# Patient Record
Sex: Male | Born: 1980 | Race: Black or African American | Hispanic: No | Marital: Single | Smoking: Never smoker
Health system: Southern US, Community
[De-identification: ages and names within clinical notes are randomized; demographics above are authoritative.]

## PROBLEM LIST (undated history)

## (undated) HISTORY — PX: NO PAST SURGERIES: SHX2092

---
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
## 2006-02-22 ENCOUNTER — Emergency Department: Payer: Self-pay | Admitting: Emergency Medicine

## 2007-08-03 ENCOUNTER — Emergency Department (HOSPITAL_COMMUNITY): Admission: EM | Admit: 2007-08-03 | Discharge: 2007-08-03 | Payer: Self-pay | Admitting: Family Medicine

## 2009-12-31 ENCOUNTER — Emergency Department: Payer: Self-pay | Admitting: Emergency Medicine

## 2010-08-14 ENCOUNTER — Emergency Department: Payer: Self-pay | Admitting: Emergency Medicine

## 2010-09-25 ENCOUNTER — Emergency Department (HOSPITAL_COMMUNITY)
Admission: EM | Admit: 2010-09-25 | Discharge: 2010-09-25 | Disposition: A | Discharge status: Home / Self Care | Payer: 59 | Attending: Emergency Medicine | Admitting: Emergency Medicine

## 2010-09-25 DIAGNOSIS — IMO0002 Reserved for concepts with insufficient information to code with codable children: Secondary | ICD-10-CM | POA: Insufficient documentation

## 2010-09-25 DIAGNOSIS — T18108A Unspecified foreign body in esophagus causing other injury, initial encounter: Secondary | ICD-10-CM | POA: Insufficient documentation

## 2012-02-04 ENCOUNTER — Emergency Department (HOSPITAL_COMMUNITY)
Admission: EM | Admit: 2012-02-04 | Discharge: 2012-02-04 | Disposition: A | Discharge status: Home / Self Care | Payer: 59 | Attending: Emergency Medicine | Admitting: Emergency Medicine

## 2012-02-04 ENCOUNTER — Encounter (HOSPITAL_COMMUNITY): Payer: Self-pay | Admitting: Emergency Medicine

## 2012-02-04 DIAGNOSIS — K089 Disorder of teeth and supporting structures, unspecified: Secondary | ICD-10-CM | POA: Insufficient documentation

## 2012-02-04 DIAGNOSIS — K044 Acute apical periodontitis of pulpal origin: Secondary | ICD-10-CM | POA: Insufficient documentation

## 2012-02-04 DIAGNOSIS — R220 Localized swelling, mass and lump, head: Secondary | ICD-10-CM | POA: Insufficient documentation

## 2012-02-04 DIAGNOSIS — K047 Periapical abscess without sinus: Secondary | ICD-10-CM

## 2012-02-04 DIAGNOSIS — R51 Headache: Secondary | ICD-10-CM | POA: Insufficient documentation

## 2012-02-04 DIAGNOSIS — K0889 Other specified disorders of teeth and supporting structures: Secondary | ICD-10-CM

## 2012-02-04 MED ORDER — BUPIVACAINE-EPINEPHRINE 0.5% -1:200000 IJ SOLN: 2.0000 mL | Freq: Once | INTRAMUSCULAR | Status: DC

## 2012-02-04 MED ORDER — OXYCODONE-ACETAMINOPHEN 5-325 MG PO TABS: 1.0000 | ORAL_TABLET | ORAL | Status: DC | PRN

## 2012-02-04 MED ORDER — PENICILLIN V POTASSIUM 250 MG PO TABS: 250.0000 mg | ORAL_TABLET | Freq: Four times a day (QID) | ORAL | Status: AC

## 2012-02-04 MED ORDER — BUPIVACAINE-EPINEPHRINE PF 0.5-1:200000 % IJ SOLN
1.8000 mL | Freq: Once | INTRAMUSCULAR | Status: DC
  Filled 2012-02-04: qty 1.8

## 2012-02-04 NOTE — ED Provider Notes (Signed)
History   This chart was scribed for non-physician practitioner working with Dione Booze, MD by Gerlean Ren, ED Scribe. This patient was seen in room WTR6/WTR6 and the patient's care was started at 8:34 PM.    CSN: [id number]  Arrival date & time 02/04/12  1953   First MD Initiated Contact with Patient 02/04/12 2021      Chief Complaint  Patient presents with  . Dental Pain     The history is provided by the patient and medical records. No language interpreter was used.   Jeremy Koch is a 32 y.o. male who presents to the Emergency Department complaining of constant, non-radiating, gradually worsening right side dental pain on upper and lower mouth as result of two suspected broken teeth.  Pt thinks he broke teeth 2 weeks ago while eating.  Associated gum swelling.  Pain has no improving or worsening factors. Pt rates the pain at a 10/10, and is sharp in nature.  Pt states today pain was worse and he noticed some right-sided facial swelling.  Pain is worse with eating and cold air and nothing makes it better.  Pt denies fever, chills, nausea, emesis, otalgia.  Pt reports mild occasional associated HA not present currently.  Pt denies tobacco and alcohol use.     History reviewed. No pertinent past medical history.  History reviewed. No pertinent past surgical history.  No family history on file.  History  Substance Use Topics  . Smoking status: Never Smoker   . Smokeless tobacco: Not on file  . Alcohol Use: No      Review of Systems  Constitutional: Negative for fever, chills and appetite change.  HENT: Positive for facial swelling and dental problem. Negative for ear pain, nosebleeds, rhinorrhea, drooling, trouble swallowing, neck pain, neck stiffness and postnasal drip.   Eyes: Negative for pain and redness.  Respiratory: Negative for cough and wheezing.   Cardiovascular: Negative for chest pain.  Gastrointestinal: Negative for nausea, vomiting and abdominal pain.    Skin: Negative for color change and rash.  Neurological: Positive for headaches. Negative for weakness and light-headedness.  All other systems reviewed and are negative.    Allergies  Review of patient's allergies indicates no known allergies.  Home Medications   Current Outpatient Rx  Name  Route  Sig  Dispense  Refill  . IBUPROFEN 200 MG PO TABS   Oral   Take 200 mg by mouth every 6 (six) hours as needed. Pain         . OXYCODONE-ACETAMINOPHEN 5-325 MG PO TABS   Oral   Take 1 tablet by mouth every 4 (four) hours as needed for pain.   20 tablet   0   . PENICILLIN V POTASSIUM 250 MG PO TABS   Oral   Take 1 tablet (250 mg total) by mouth 4 (four) times daily.   40 tablet   0     BP 155/77  Pulse 98  Temp 99.1 F (37.3 C)  Resp 16  SpO2 100%  Physical Exam  Nursing note and vitals reviewed. Constitutional: He is oriented to person, place, and time. He appears well-developed and well-nourished.  HENT:  Head: Normocephalic.  Right Ear: Tympanic membrane, external ear and ear canal normal.  Left Ear: Tympanic membrane, external ear and ear canal normal.  Nose: Nose normal. Right sinus exhibits no maxillary sinus tenderness and no frontal sinus tenderness. Left sinus exhibits no maxillary sinus tenderness and no frontal sinus tenderness.  Mouth/Throat: Uvula is  midline, oropharynx is clear and moist and mucous membranes are normal. No oral lesions. Abnormal dentition. Dental caries present. No uvula swelling or lacerations. No oropharyngeal exudate, posterior oropharyngeal edema, posterior oropharyngeal erythema or tonsillar abscesses.         Dental carries throughout, no broken teeth, right side upper and lower gumline erythema with mild edema noted around marked teeth, no gross abscess, no edema or erythema of floor of mouth  Eyes: Conjunctivae normal are normal. Pupils are equal, round, and reactive to light. Right eye exhibits no discharge. Left eye exhibits no  discharge.  Neck: Normal range of motion. Neck supple. No spinous process tenderness and no muscular tenderness present. No rigidity. Normal range of motion present.  Cardiovascular: Normal rate, regular rhythm, normal heart sounds and intact distal pulses.   Pulmonary/Chest: Effort normal and breath sounds normal. No respiratory distress. He has no wheezes.  Musculoskeletal: Normal range of motion. He exhibits no tenderness.  Lymphadenopathy:    He has no cervical adenopathy.  Neurological: He is alert and oriented to person, place, and time. He exhibits normal muscle tone. Coordination normal.  Skin: Skin is warm and dry. No rash noted. No erythema.  Psychiatric: He has a normal mood and affect.    ED Course  Dental Date/Time: 02/04/2012 9:05 PM Performed by: Dierdre Forth Authorized by: Dierdre Forth Consent: Verbal consent obtained. Risks and benefits: risks, benefits and alternatives were discussed Consent given by: patient Patient understanding: patient states understanding of the procedure being performed Patient consent: the patient's understanding of the procedure matches consent given Procedure consent: procedure consent matches procedure scheduled Relevant documents: relevant documents present and verified Site marked: the operative site was marked Required items: required blood products, implants, devices, and special equipment available Patient identity confirmed: verbally with patient Time out: Immediately prior to procedure a "time out" was called to verify the correct patient, procedure, equipment, support staff and site/side marked as required. Preparation: Patient was prepped and draped in the usual sterile fashion. Local anesthesia used: yes Anesthesia: local infiltration Local anesthetic: bupivacaine 0.5% with epinephrine Anesthetic total: 0.5 ml Patient sedated: no Patient tolerance: Patient tolerated the procedure well with no immediate  complications. Comments: Dental block of teeth # 3 and #30.  Pt tolerated procedure without complication.  Complete pain relief achieved.    (including critical care time) DIAGNOSTIC STUDIES: Oxygen Saturation is 100% on room air, normal by my interpretation.    COORDINATION OF CARE: 8:40 PM- Patient informed of clinical course including dental block for pain, understands medical decision-making process, and agrees with plan.  Informed pt that I will provide him dentist information so that he can follow-up.     No results found.   1. Pain, dental   2. Dental infection       MDM  Bo Merino presents with dental pain and possible dental infection.  Patient with toothache.  No gross abscess.  Exam unconcerning for Ludwig's angina or spread of infection.  Dental block performed in the department without complication.  Will treat with penicillin and pain medicine.  Urged patient to follow-up with dentist.    1. Medications: percocet, penicillin, usual home medications 2. Treatment: rest, drink plenty of fluids, medications as prescribed 3. Follow Up: Please followup with your primary doctor for discussion of your diagnoses and further evaluation after today's visit; if you do not have a primary care doctor use the resource guide provided to find one; followup with Maurine Minister as discussed   I personally  performed the services described in this documentation, which was scribed in my presence. The recorded information has been reviewed and is accurate.        Dierdre Forth, PA-C 02/04/12 2111

## 2012-02-04 NOTE — ED Notes (Signed)
Pt reports 10/10 dental pain x1 week.

## 2012-02-04 NOTE — ED Provider Notes (Signed)
Medical screening examination/treatment/procedure(s) were performed by non-physician practitioner and as supervising physician I was immediately available for consultation/collaboration.   Dione Booze, MD 02/04/12 2114

## 2014-01-06 ENCOUNTER — Emergency Department (HOSPITAL_COMMUNITY)
Admission: EM | Admit: 2014-01-06 | Discharge: 2014-01-06 | Disposition: A | Discharge status: Home / Self Care | Payer: Self-pay | Attending: Emergency Medicine | Admitting: Emergency Medicine

## 2014-01-06 ENCOUNTER — Encounter (HOSPITAL_COMMUNITY): Payer: Self-pay | Admitting: Emergency Medicine

## 2014-01-06 ENCOUNTER — Emergency Department (HOSPITAL_COMMUNITY): Payer: 59

## 2014-01-06 DIAGNOSIS — L03211 Cellulitis of face: Secondary | ICD-10-CM | POA: Insufficient documentation

## 2014-01-06 DIAGNOSIS — K047 Periapical abscess without sinus: Secondary | ICD-10-CM | POA: Insufficient documentation

## 2014-01-06 LAB — I-STAT CHEM 8, ED
BUN: 15 mg/dL (ref 6–23)
CHLORIDE: 101 meq/L (ref 96–112)
Calcium, Ion: 1.15 mmol/L (ref 1.12–1.23)
Creatinine, Ser: 0.9 mg/dL (ref 0.50–1.35)
GLUCOSE: 104 mg/dL — AB (ref 70–99)
HEMATOCRIT: 46 % (ref 39.0–52.0)
Hemoglobin: 15.6 g/dL (ref 13.0–17.0)
Potassium: 4.1 mmol/L (ref 3.5–5.1)
Sodium: 137 mmol/L (ref 135–145)
TCO2: 21 mmol/L (ref 0–100)

## 2014-01-06 MED ORDER — MORPHINE SULFATE 4 MG/ML IJ SOLN
4.0000 mg | Freq: Once | INTRAMUSCULAR | Status: AC
  Administered 2014-01-06: 4 mg via INTRAVENOUS
  Filled 2014-01-06: qty 1

## 2014-01-06 MED ORDER — OXYCODONE-ACETAMINOPHEN 5-325 MG PO TABS: 1.0000 | ORAL_TABLET | Freq: Four times a day (QID) | ORAL | Status: DC | PRN

## 2014-01-06 MED ORDER — PENICILLIN V POTASSIUM 500 MG PO TABS: 500.0000 mg | ORAL_TABLET | Freq: Four times a day (QID) | ORAL | Status: AC

## 2014-01-06 MED ORDER — IOHEXOL 300 MG/ML  SOLN
100.0000 mL | Freq: Once | INTRAMUSCULAR | Status: AC | PRN
  Administered 2014-01-06: 100 mL via INTRAVENOUS

## 2014-01-06 MED ORDER — MORPHINE SULFATE 4 MG/ML IJ SOLN: 4.0000 mg | Freq: Once | INTRAMUSCULAR | Status: DC

## 2014-01-06 NOTE — ED Notes (Signed)
Swelling noted on l/side of face, back of jaw. Pt reports pain and swelling in mouth x 24 hours. Pain started as dental pain and increased over last 12 hours.

## 2014-01-06 NOTE — Discharge Instructions (Signed)
Abscessed Tooth An abscessed tooth is an infection around your tooth. It may be caused by holes or damage to the tooth (cavity) or a dental disease. An abscessed tooth causes mild to very bad pain in and around the tooth. See your dentist right away if you have tooth or gum pain. HOME CARE  Take your medicine as told. Finish it even if you start to feel better.  Do not drive after taking pain medicine.  Rinse your mouth (gargle) often with salt water ( teaspoon salt in 8 ounces of warm water).  Do not apply heat to the outside of your face. GET HELP RIGHT AWAY IF:   You have a temperature by mouth above 102 F (38.9 C), not controlled by medicine.  You have chills and a very bad headache.  You have problems breathing or swallowing.  Your mouth will not open.  You develop puffiness (swelling) on the neck or around the eye.  Your pain is not helped by medicine.  Your pain is getting worse instead of better. MAKE SURE YOU:   Understand these instructions.  Will watch your condition.  Will get help right away if you are not doing well or get worse. Document Released: 06/09/2007 Document Revised: 03/15/2011 Document Reviewed: 03/31/2010 [employer] Patient Information 2015 [employer]. This information is not intended to replace advice given to you by your health care provider. Make sure you discuss any questions you have with your health care provider.  

## 2014-01-06 NOTE — ED Provider Notes (Signed)
Medical screening exam.  Pt with left lower dental pain and swelling that began yesterday.  He is currently febrile and has trismus, swelling tracks under left mandible.  Likely dental abscess.  Concern for deep space infection.  No current airway concerns.  Pt to move to main ED.  Orders placed for peripheral IV, morphine, CT neck w contrast, chem-8.    Trixie Dredge, PA-C 01/06/14 1150

## 2014-01-06 NOTE — ED Provider Notes (Signed)
CSN: [id number]     Arrival date & time 01/06/14  1040 History   First MD Initiated Contact with Patient 01/06/14 1056     Chief Complaint  Patient presents with  . Dental Pain  . Facial Swelling    l/facial swelling     (Consider location/radiation/quality/duration/timing/severity/associated sxs/prior Treatment) Patient is a 34 y.o. male presenting with tooth pain. The history is provided by the patient. No language interpreter was used.  Dental Pain Location:  Upper and lower Upper teeth location:  16/LU 3rd molar Lower teeth location:  17/LL 3rd molar Quality:  Aching Severity:  Moderate Duration:  2 days Timing:  Constant Progression:  Worsening Chronicity:  New Relieved by:  Nothing Worsened by:  Touching, hot food/drink and cold food/drink Ineffective treatments:  None tried Associated symptoms: facial pain, facial swelling, fever and trismus   Associated symptoms: no congestion and no headaches     History reviewed. No pertinent past medical history. History reviewed. No pertinent past surgical history. History reviewed. No pertinent family history. History  Substance Use Topics  . Smoking status: Never Smoker   . Smokeless tobacco: Not on file  . Alcohol Use: No    Review of Systems  Constitutional: Positive for fever. Negative for activity change, appetite change and fatigue.  HENT: Positive for dental problem and facial swelling. Negative for congestion, rhinorrhea and trouble swallowing.   Eyes: Negative for photophobia and pain.  Respiratory: Negative for cough, chest tightness and shortness of breath.   Cardiovascular: Negative for chest pain and leg swelling.  Gastrointestinal: Negative for nausea, vomiting, abdominal pain, diarrhea and constipation.  Endocrine: Negative for polydipsia and polyuria.  Genitourinary: Negative for dysuria, urgency, decreased urine volume and difficulty urinating.  Musculoskeletal: Negative for back pain and gait problem.    Skin: Negative for color change, rash and wound.  Allergic/Immunologic: Negative for immunocompromised state.  Neurological: Negative for dizziness, facial asymmetry, speech difficulty, weakness, numbness and headaches.  Psychiatric/Behavioral: Negative for confusion, decreased concentration and agitation.      Allergies  Review of patient's allergies indicates no known allergies.  Home Medications   Prior to Admission medications   Medication Sig Start Date End Date Taking? Authorizing Provider  ibuprofen (ADVIL,MOTRIN) 200 MG tablet Take 800 mg by mouth every 6 (six) hours as needed for moderate pain. Pain   Yes Historical Provider, MD  oxyCODONE-acetaminophen (PERCOCET) 5-325 MG per tablet Take 1 tablet by mouth every 6 (six) hours as needed. 01/06/14   Toy Cookey, MD  penicillin v potassium (VEETID) 500 MG tablet Take 1 tablet (500 mg total) by mouth 4 (four) times daily. 01/06/14 01/13/14  Toy Cookey, MD   BP 131/69 mmHg  Pulse 98  Temp(Src) 100.3 F (37.9 C) (Oral)  Resp 20  Wt 150 lb (68.04 kg)  SpO2 100% Physical Exam  Constitutional: He is oriented to person, place, and time. He appears well-developed and well-nourished. No distress.  HENT:  Head: Normocephalic and atraumatic.    Mouth/Throat: No oropharyngeal exudate.  Swelling & induration of L lower face, worst at angle of mandible. Trismus makes full eval of teeth difficult. Cannot appreciate area of fluctuance, pointing or drainage.   Eyes: Pupils are equal, round, and reactive to light.  Neck: Normal range of motion. Neck supple.  Cardiovascular: Normal rate, regular rhythm and normal heart sounds.  Exam reveals no gallop and no friction rub.   No murmur heard. Pulmonary/Chest: Effort normal and breath sounds normal. No respiratory distress. He has no  wheezes. He has no rales.  Abdominal: Soft. Bowel sounds are normal. He exhibits no distension and no mass. There is no tenderness. There is no rebound and no  guarding.  Musculoskeletal: Normal range of motion. He exhibits no edema or tenderness.  Neurological: He is alert and oriented to person, place, and time.  Skin: Skin is warm and dry.  Psychiatric: He has a normal mood and affect.    ED Course  Procedures (including critical care time) Labs Review Labs Reviewed  I-STAT CHEM 8, ED - Abnormal; Notable for the following:    Glucose, Bld 104 (*)    All other components within normal limits    Imaging Review Ct Soft Tissue Neck W Contrast  01/06/2014   CLINICAL DATA:  Left lower a dental pain and swelling that began yesterday now tracking under left mandible. Fever.  EXAM: CT NECK WITH CONTRAST  TECHNIQUE: Multidetector CT imaging of the neck was performed using the standard protocol following the bolus administration of intravenous contrast.  CONTRAST:  OMNIPAQUE IOHEXOL 300 MG/ML  SOLN  COMPARISON:  None.  FINDINGS: Pharynx and larynx: Normal in appearance without abnormal enhancement more asymmetry.  Salivary glands: Normal.  Thyroid: Normal without evidence for focal nodularity.  Lymph nodes: Scattered small lymph nodes noted bilaterally without evidence for lymphadenopathy.  Vascular: Major arterial and venous anatomy of the neck opacifies normally.  Limited intracranial: Normal in appearance.  Mastoids and visualized paranasal sinuses: Clear.  Skeleton: There is edema/ inflammation in the lower left face and upper jaw. 18 x 8 x 5 mm rim enhancing fluid collection is seen immediately adjacent to the outer cortex of the left mandibular angle. This is immediately adjacent to tooth 16, the inferior left wisdom tooth which has not completely emerged, but appears to have an associated cavity.  Upper chest: Unremarkable.  IMPRESSION: Small abscess along the outer cortex of the left mandible, at the angle. This is superficial to the unerupted left lower wisdom tooth which appears to have a fairly prominent cavity. As such, imaging features are felt  to be compatible with dental abscess.   Electronically Signed   By: Kennith Center M.D.   On: 01/06/2014 12:43     EKG Interpretation None      MDM   Final diagnoses:  Dental abscess  Facial cellulitis    Pt is a 34 y.o. male with Pmhx as above who presents with 2 days of dental pain now with facial swelling, trismus, fever. CT face with small abscess, likely dental source. Cannot visualize or palpate drainable abscess. Spoke w/ Dr. Chales Salmon with ENT who will see in the office tomorrow. Pt will be started on Pen VK, norco for pain.      Bo Merino evaluation in the Emergency Department is complete. It has been determined that no acute conditions requiring further emergency intervention are present at this time. The patient/guardian have been advised of the diagnosis and plan. We have discussed signs and symptoms that warrant return to the ED, such as changes or worsening in symptoms, worsening pain, swelling, difficulty swallowing.       Toy Cookey, MD 01/06/14 [phone]

## 2014-01-10 ENCOUNTER — Inpatient Hospital Stay: Payer: Self-pay | Admitting: Internal Medicine

## 2014-01-10 LAB — BASIC METABOLIC PANEL
Anion Gap: 8 (ref 7–16)
BUN: 13 mg/dL (ref 7–18)
CREATININE: 1.02 mg/dL (ref 0.60–1.30)
Calcium, Total: 9 mg/dL (ref 8.5–10.1)
Chloride: 96 mmol/L — ABNORMAL LOW (ref 98–107)
Co2: 27 mmol/L (ref 21–32)
EGFR (African American): >60
GLUCOSE: 147 mg/dL — AB (ref 65–99)
Osmolality: 265 (ref 275–301)
POTASSIUM: 3.9 mmol/L (ref 3.5–5.1)
SODIUM: 131 mmol/L — AB (ref 136–145)

## 2014-01-10 LAB — CBC WITH DIFFERENTIAL/PLATELET
Basophil #: 0.1 10*3/uL (ref 0.0–0.1)
Basophil %: 0.4 %
EOS ABS: 0 10*3/uL (ref 0.0–0.7)
Eosinophil %: 0.3 %
HCT: 43.7 % (ref 40.0–52.0)
HGB: 14.8 g/dL (ref 13.0–18.0)
LYMPHS ABS: 0.8 10*3/uL — AB (ref 1.0–3.6)
Lymphocyte %: 6.1 %
MCH: 28.9 pg (ref 26.0–34.0)
MCHC: 33.9 g/dL (ref 32.0–36.0)
MCV: 85 fL (ref 80–100)
MONOS PCT: 7.7 %
Monocyte #: 1 x10 3/mm (ref 0.2–1.0)
NEUTROS ABS: 11.2 10*3/uL — AB (ref 1.4–6.5)
Neutrophil %: 85.5 %
Platelet: 202 10*3/uL (ref 150–440)
RBC: 5.13 10*6/uL (ref 4.40–5.90)
RDW: 12.6 % (ref 11.5–14.5)
WBC: 13.1 10*3/uL — ABNORMAL HIGH (ref 3.8–10.6)

## 2014-01-11 LAB — CBC WITH DIFFERENTIAL/PLATELET
BASOS ABS: 0 10*3/uL (ref 0.0–0.1)
BASOS PCT: 0.1 %
EOS ABS: 0.1 10*3/uL (ref 0.0–0.7)
Eosinophil %: 1.3 %
HCT: 37.6 % — ABNORMAL LOW (ref 40.0–52.0)
HGB: 12.6 g/dL — AB (ref 13.0–18.0)
Lymphocyte #: 1.4 10*3/uL (ref 1.0–3.6)
Lymphocyte %: 12.4 %
MCH: 28.7 pg (ref 26.0–34.0)
MCHC: 33.5 g/dL (ref 32.0–36.0)
MCV: 86 fL (ref 80–100)
MONO ABS: 1.1 x10 3/mm — AB (ref 0.2–1.0)
Monocyte %: 9.9 %
NEUTROS PCT: 76.3 %
Neutrophil #: 8.6 10*3/uL — ABNORMAL HIGH (ref 1.4–6.5)
PLATELETS: 183 10*3/uL (ref 150–440)
RBC: 4.39 10*6/uL — ABNORMAL LOW (ref 4.40–5.90)
RDW: 12.7 % (ref 11.5–14.5)
WBC: 11.3 10*3/uL — ABNORMAL HIGH (ref 3.8–10.6)

## 2014-01-11 LAB — BASIC METABOLIC PANEL
Anion Gap: 9 (ref 7–16)
BUN: 9 mg/dL (ref 7–18)
CHLORIDE: 104 mmol/L (ref 98–107)
CO2: 25 mmol/L (ref 21–32)
Calcium, Total: 8.3 mg/dL — ABNORMAL LOW (ref 8.5–10.1)
Creatinine: 0.96 mg/dL (ref 0.60–1.30)
EGFR (African American): >60
EGFR (Non-African Amer.): >60
Glucose: 86 mg/dL (ref 65–99)
Osmolality: 274 (ref 275–301)
POTASSIUM: 4.1 mmol/L (ref 3.5–5.1)
SODIUM: 138 mmol/L (ref 136–145)

## 2014-01-12 LAB — CBC WITH DIFFERENTIAL/PLATELET
BASOS ABS: 0 10*3/uL (ref 0.0–0.1)
BASOS PCT: 0.4 %
Eosinophil #: 0.5 10*3/uL (ref 0.0–0.7)
Eosinophil %: 5.1 %
HCT: 36.5 % — AB (ref 40.0–52.0)
HGB: 12.3 g/dL — AB (ref 13.0–18.0)
Lymphocyte #: 1.5 10*3/uL (ref 1.0–3.6)
Lymphocyte %: 15.8 %
MCH: 28.9 pg (ref 26.0–34.0)
MCHC: 33.7 g/dL (ref 32.0–36.0)
MCV: 86 fL (ref 80–100)
MONO ABS: 0.7 x10 3/mm (ref 0.2–1.0)
Monocyte %: 7.5 %
NEUTROS ABS: 6.5 10*3/uL (ref 1.4–6.5)
Neutrophil %: 71.2 %
Platelet: 201 10*3/uL (ref 150–440)
RBC: 4.26 10*6/uL — ABNORMAL LOW (ref 4.40–5.90)
RDW: 12.6 % (ref 11.5–14.5)
WBC: 9.2 10*3/uL (ref 3.8–10.6)

## 2014-01-15 LAB — CULTURE, BLOOD (SINGLE)

## 2014-05-05 NOTE — H&P (Signed)
PATIENT NAME:  Jeremy Koch, Jeremy Koch MR#:  [id number] DATE OF BIRTH:  01/24/1980  DATE OF ADMISSION:  01/10/2014  PRIMARY CARE PHYSICIAN: None.   CHIEF COMPLAINT: Left side of the face with swelling.   HISTORY OF PRESENT ILLNESS: Mr. Jeremy Koch is a 34 year old male who had a tooth extraction after patient was found to have tooth abscess. The upper molar was removed and was given penicillin. The patient did not have improvement, continued to have significant swelling of the face. The patient is unable to open the mouth. In the Emergency Department the patient received 1 dose of Zosyn.    PAST MEDICAL HISTORY: None.   PAST SURGICAL HISTORY: None.   ALLERGIES: No known drug allergies.   HOME MEDICATIONS:  1.   1 tablet every 6 hours.  2.  Percocet 10/325 every 4 to 6 hours as needed.   SOCIAL HISTORY: No history of smoking, drinking alcohol or using illicit drugs. Lives by himself.   FAMILY HISTORY: No obvious health problems run in the family.   REVIEW OF SYSTEMS:  CONSTITUTIONAL: Experiencing generalized weakness.  EYES: No change in vision.  EARS, NOSE, AND THROAT: No change in hearing.  RESPIRATORY: Has cough, shortness of breath.  CARDIOVASCULAR: No chest pain, palpations.  GASTROINTESTINAL: No nausea, vomiting, abdominal pain.  GENITOURINARY: No dysuria or hematuria.  HEMATOLOGIC: No easy bruising or bleeding.  SKIN: No rash or lesions.  MUSCULOSKELETAL: No joint pains and aches.  NEUROLOGIC: No weakness or numbness in any part of the body.   PHYSICAL EXAMINATION:  GENERAL: This is a well-developed, well-nourished, age-appropriate male laying down in the bed not in distress.  VITAL SIGNS: Temperature 99.2, pulse 108, blood pressure 146/87, respiratory rate of 18, oxygen saturation is 97% on room air.  HEENT: Head normocephalic, atraumatic. There is no scleral icterus. Conjunctivae normal. Pupils equal and react to light. Mucous membranes moist. No pharyngeal erythema.    NECK: Supple. No lymphadenopathy. No JVD. No carotid bruit.  HEART: S1, S2 regular. No murmurs are heard.  ABDOMEN: Bowel sounds plus. Soft, nontender, nondistended.  EXTREMITIES: No pedal edema. Pulses 2+.  ABDOMEN: Bowel sounds plus. Soft, nontender, nondistended. No hepatosplenomegaly.  EXTREMITIES: No pedal edema. Pulses 2+.  NEUROLOGICAL: the patient is alert, oriented to place, person, and time. Cranial nerves II through XII intact. Motor 5/5 in upper and lower extremities.   LABORATORY DATA: CBC: WBC of 13,000, hemoglobin 15, platelet count of 202. CMP: . Rest of all the values are within normal limits.   CT neck without contrast shows  marked inflammation involving the left perioral soft tissue in the neck, bony destructive change involving the left upper molar extending into the lateral cortex and inferior maxillary sinus consistent with history of tooth abscess. Diffuse inflammation of the left subcutaneous soft tissues, muscles of mastication and perioral tissues, left  muscle was diffusely enlarged, heterogenous with well defined hypodensity,  phlegmon, however, discrete peripheral enhancing abscesses is not seen. No definite drainable fluid collection in the soft tissues.   ASSESSMENT AND PLAN:  1.  Facial Cellulitis. Continue the Zosyn covering for gram-negative  as well as gram-positive cocci.  2.  Keep the patient on deep vein thrombosis prophylaxis with Lovenox.   TIME SPENT: 55 minutes.   ____________________________ Susa GriffinsPadmaja Fatimah Sundquist, MD pv:AT D: 01/11/2014 02:48:01 ET T: 01/11/2014 03:39:20 ET JOB#: [id number]  cc: Susa GriffinsPadmaja Ethridge Sollenberger, MD, <Dictator> Susa GriffinsPADMAJA Bodhi Stenglein MD ELECTRONICALLY SIGNED 01/12/2014 23:52

## 2014-05-05 NOTE — Discharge Summary (Signed)
PATIENT NAME:  Jeremy Koch, Jeremy Koch MR#:  [id number] DATE OF BIRTH:  08/14/1980  DATE OF ADMISSION:  01/10/2014 DATE OF DISCHARGE:  01/12/2014  PRESENTING COMPLAINT: Facial pain and swelling on the left side.   DISCHARGE DIAGNOSIS:  Left facial cellulitis status post left upper molar extraction for dental caries/abscess.   CODE STATUS: Full code.  DISCHARGE MEDICATIONS:    1. Continue penicillin V 500 mg every 6 hours as prescribed by your dental surgeon.  2. Ibuprofen 400 mg every 6 hours for next 3-4 days, then p.r.n.  3. Acetaminophen/hydrocodone 10/325 one every 4-6 hours as needed.   DISCHARGE DIET:  1.  Regular.  2.  Ensure 3 times a day.  3.  The patient advised pureed diet until his swelling comes down.   FOLLOWUP:  With Dr. Chales Salmonwsley, dental surgeon on your appointment Monday at 11:00 a.m.    BRIEF SUMMARY OF HOSPITAL COURSE: Jeremy Koch is a 34 year old African-American gentleman with no significant past medical history, comes in with:   Left facial and neck cellulitis. The patient had recently had a left upper molar tooth extracted for abscess. He was taking antibiotics and pain medication, however his pain increased. He was changed to IV Unasyn. While he was here in the hospital p.r.n. Percocet was given. CT of the neck with contrast was done which mainly showed inflammation regarding the left perioral soft tissue and neck, there is bony destructive change involving the left upper molar extending to the lateral cortex and inferior maxillary sinus consistent with the stated history of tooth abscess, diffuse inflammation of left subcutaneous soft tissue muscles of mastication, and perioral soft tissue. No definite drainable fluid collections in the soft tissue.  The patient's white count remained stable. He was switched back to his oral penicillin and will follow up with his oral surgeon closely. The patient was also advised to keep good oral hygiene. Blood cultures remained  negative. The patient remained afebrile. White count was stable.     Overall hospital stay otherwise remained stable.   TIME SPENT: 40 minutes.      ____________________________ Wylie HailSona A. Allena KatzPatel, MD sap:bu D: 01/17/2014 10:34:09 ET T: 01/17/2014 15:31:17 ET JOB#: [id number]  cc: Oluwatoni Rotunno A. Allena KatzPatel, MD, <Dictator> Willow OraSONA A Laurin Paulo MD ELECTRONICALLY SIGNED 01/18/2014 12:17

## 2016-01-09 IMAGING — CT CT NECK W/ CM
2 of 3 series · 9 of 14 positions shown, 11 images · IV contrast (omnipaque)
Comparison: None.

CLINICAL DATA: Left lower a dental pain and swelling that began
yesterday now tracking under left mandible. Fever.

EXAM:
CT NECK WITH CONTRAST
TECHNIQUE: Multidetector CT imaging of the neck was performed using the
standard protocol following the bolus administration of intravenous
contrast.
CONTRAST:  100mL OMNIPAQUE IOHEXOL 300 MG/ML  SOLN

[Series 3: neck with st · axial · 0.39mm/px · z∈[-188,-62]mm · 4 of 107 slices shown]
[im 22/107  bone]
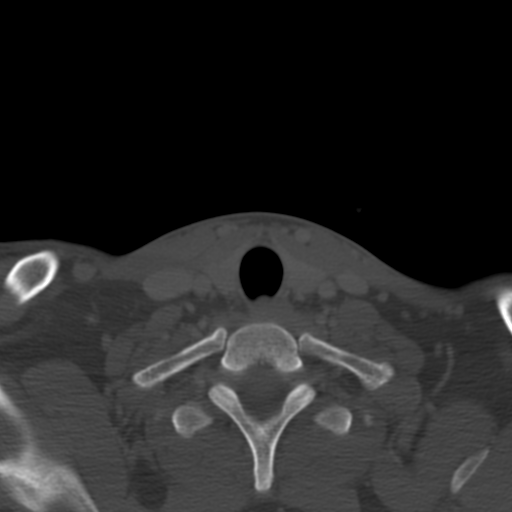
[im 43/107  bone]
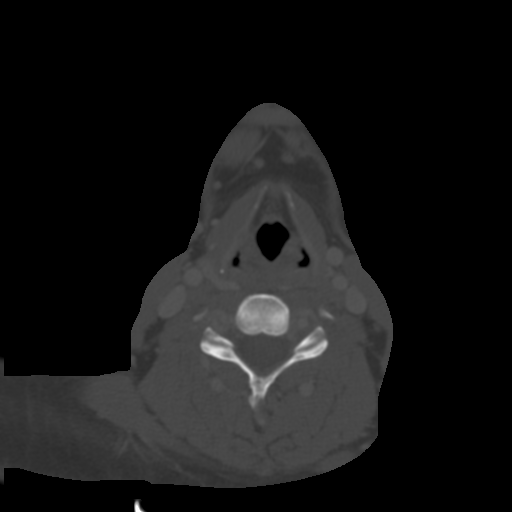
[im 64/107  bone]
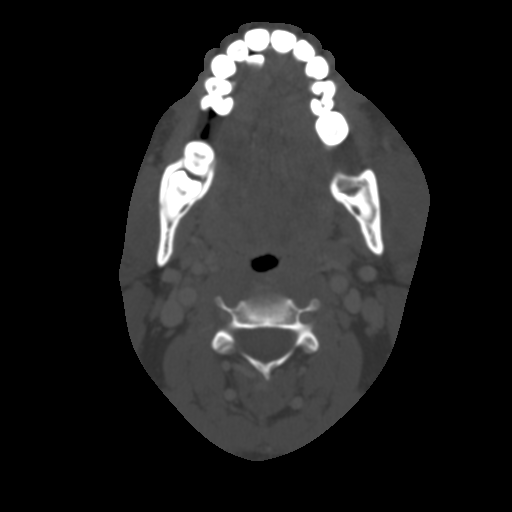
[im 85/107  bone]
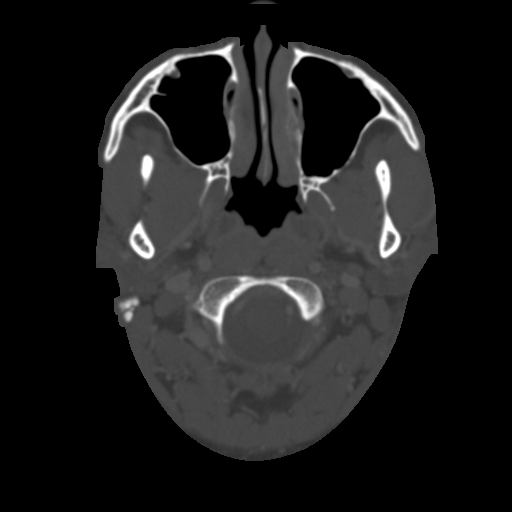

[Series 8: axial recons · axial · 0.39mm/px · z∈[-224,-59]mm · 5 of 127 slices shown, 7 images]
[im 22/127  soft-tissue]
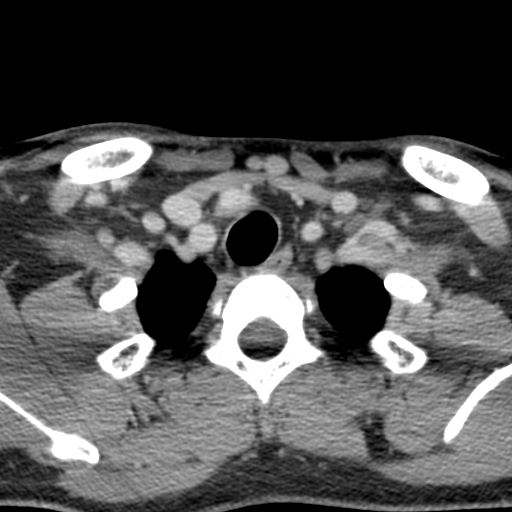
[im 22/127  bone]
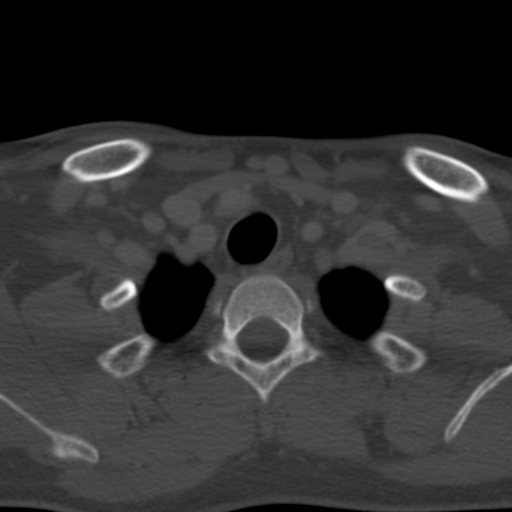
[im 43/127  bone]
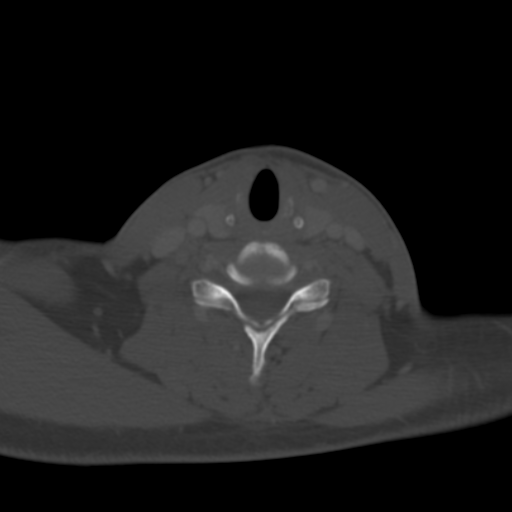
[im 64/127  bone]
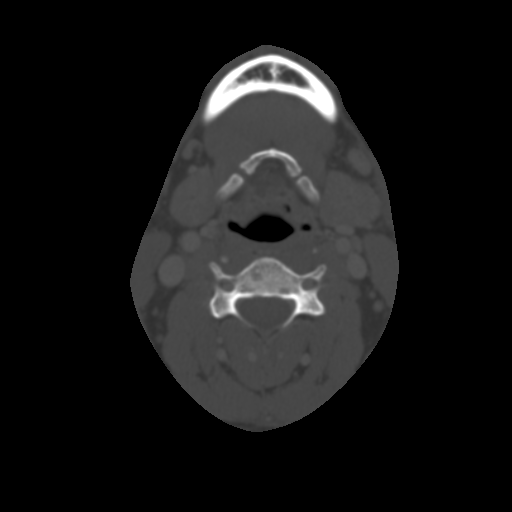
[im 85/127  bone]
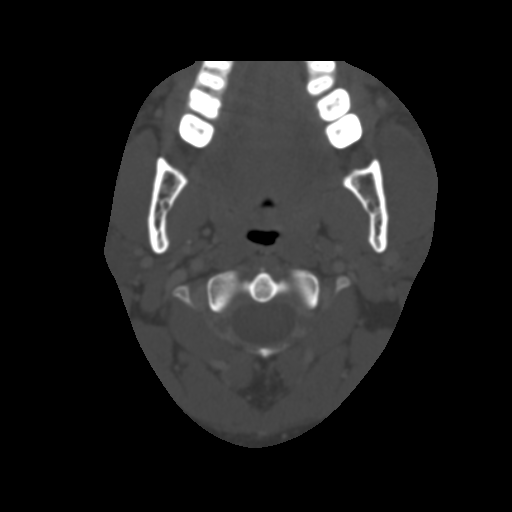
[im 106/127  soft-tissue]
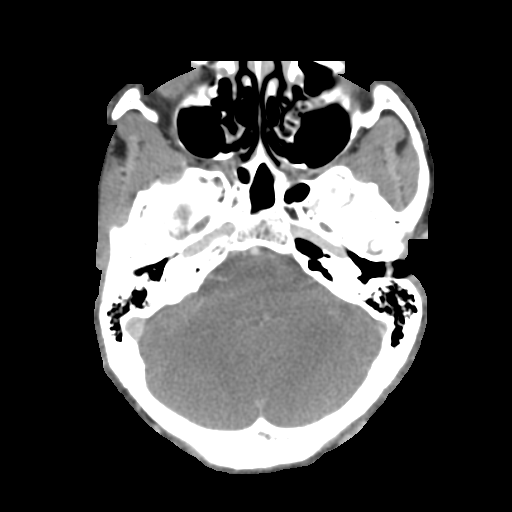
[im 106/127  bone]
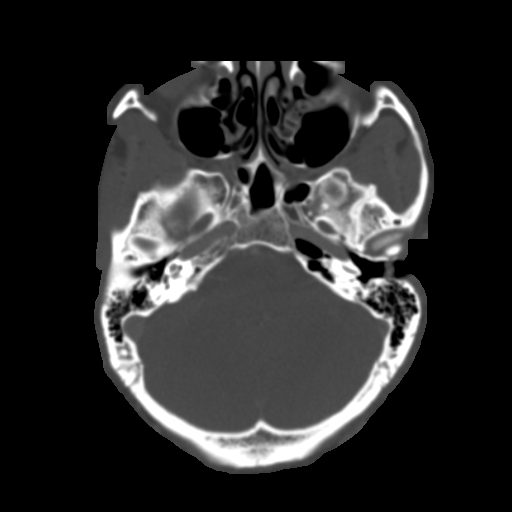

[9 of 14 positions shown; findings below may reference images not displayed]

FINDINGS: Pharynx and larynx: Normal in appearance without abnormal
enhancement more asymmetry.

Salivary glands: Normal.

Thyroid: Normal without evidence for focal nodularity.

Lymph nodes: Scattered small lymph nodes noted bilaterally without
evidence for lymphadenopathy.

Vascular: Major arterial and venous anatomy of the neck opacifies
normally.

Limited intracranial: Normal in appearance.

Mastoids and visualized paranasal sinuses: Clear.

Skeleton: There is edema/ inflammation in the lower left face and
upper jaw. 18 x 8 x 5 mm rim enhancing fluid collection is seen
immediately adjacent to the outer cortex of the left mandibular
angle. This is immediately adjacent to tooth 16, the inferior left
wisdom tooth which has not completely emerged, but appears to have
an associated cavity.

Upper chest: Unremarkable.
IMPRESSION: Small abscess along the outer cortex of the left mandible, at the
angle. This is superficial to the unerupted left lower wisdom tooth
which appears to have a fairly prominent cavity. As such, imaging
features are felt to be compatible with dental abscess.

## 2019-05-05 ENCOUNTER — Ambulatory Visit
Admission: EM | Admit: 2019-05-05 | Discharge: 2019-05-05 | Disposition: A | Discharge status: Home / Self Care | Payer: Managed Care, Other (non HMO) | Attending: Family Medicine | Admitting: Family Medicine

## 2019-05-05 ENCOUNTER — Other Ambulatory Visit: Payer: Self-pay

## 2019-05-05 ENCOUNTER — Encounter: Payer: Self-pay | Admitting: Emergency Medicine

## 2019-05-05 DIAGNOSIS — J069 Acute upper respiratory infection, unspecified: Secondary | ICD-10-CM | POA: Diagnosis not present

## 2019-05-05 DIAGNOSIS — J029 Acute pharyngitis, unspecified: Secondary | ICD-10-CM | POA: Diagnosis present

## 2019-05-05 DIAGNOSIS — Z20822 Contact with and (suspected) exposure to covid-19: Secondary | ICD-10-CM | POA: Insufficient documentation

## 2019-05-05 LAB — GROUP A STREP BY PCR: Group A Strep by PCR: NOT DETECTED

## 2019-05-05 MED ORDER — CETIRIZINE-PSEUDOEPHEDRINE ER 5-120 MG PO TB12: 1.0000 | ORAL_TABLET | Freq: Two times a day (BID) | ORAL | 0 refills | Status: DC

## 2019-05-05 MED ORDER — IPRATROPIUM BROMIDE 0.06 % NA SOLN: 2.0000 | Freq: Four times a day (QID) | NASAL | 0 refills | Status: DC | PRN

## 2019-05-05 NOTE — ED Provider Notes (Signed)
[facility]    CSN: [id number] Arrival date & time: 05/05/19  1526 History   Chief Complaint Chief Complaint  Patient presents with  . Sore Throat  . Nasal Congestion   HPI  39 year old male presents with cough, congestion, sore throat.  Patient reports that his symptoms began on Tuesday.  Had a recent Covid vaccine on Monday.  Reports sore throat, congestion as well as rhinorrhea.  Also reports mild cough.  No fever.  No relieving factors.  He has taken over-the-counter medication without resolution.  No other associated symptoms.  No other complaints.  Home Medications    Prior to Admission medications   Medication Sig Start Date End Date Taking? Authorizing Provider  cetirizine-pseudoephedrine (ZYRTEC-D) 5-120 MG tablet Take 1 tablet by mouth 2 (two) times daily. 05/05/19   Coral Spikes, DO  ibuprofen (ADVIL,MOTRIN) 200 MG tablet Take 800 mg by mouth every 6 (six) hours as needed for moderate pain. Pain    [provider]  ipratropium (ATROVENT) 0.06 % nasal spray Place 2 sprays into both nostrils 4 (four) times daily as needed for rhinitis. 05/05/19   Coral Spikes, DO   Social History Social History   Tobacco Use  . Smoking status: Never Smoker  . Smokeless tobacco: Never Used  Substance Use Topics  . Alcohol use: No  . Drug use: No     Allergies   Patient has no known allergies.   Review of Systems Review of Systems  Constitutional: Negative for fever.  HENT: Positive for congestion, rhinorrhea and sore throat.   Respiratory: Positive for cough.    Physical Exam Triage Vital Signs ED Triage Vitals  Enc Vitals Group     BP 05/05/19 1543 (!) 154/89     Pulse Rate 05/05/19 1543 63     Resp 05/05/19 1543 16     Temp 05/05/19 1543 98.7 F (37.1 C)     Temp Source 05/05/19 1543 Oral     SpO2 05/05/19 1543 98 %     Weight 05/05/19 1541 183 lb (83 kg)     Height 05/05/19 1541 5\' 7"  (1.702 m)     Head Circumference --      Peak Flow --    Pain Score 05/05/19 1541 8     Pain Loc --      Pain Edu? --      Excl. in [place]? --    Updated Vital Signs BP (!) 154/89 (BP Location: Left Arm)   Pulse 63   Temp 98.7 F (37.1 C) (Oral)   Resp 16   Ht 5\' 7"  (1.702 m)   Wt 83 kg   SpO2 98%   BMI 28.66 kg/m      Physical Exam Vitals and nursing note reviewed.  Constitutional:      General: He is not in acute distress.    Appearance: Normal appearance. He is not ill-appearing.  HENT:     Head: Normocephalic and atraumatic.     Right Ear: Tympanic membrane normal.     Left Ear: Tympanic membrane normal.     Mouth/Throat:     Pharynx: Posterior oropharyngeal erythema present. No oropharyngeal exudate.  Cardiovascular:     Rate and Rhythm: Normal rate and regular rhythm.  Pulmonary:     Effort: Pulmonary effort is normal.     Breath sounds: Normal breath sounds. No wheezing, rhonchi or rales.  Neurological:     Mental Status: He is alert.  Psychiatric:  Mood and Affect: Mood normal.        Behavior: Behavior normal.    UC Treatments / Results  Labs (all labs ordered are listed, but only abnormal results are displayed) Labs Reviewed  GROUP A STREP BY PCR  SARS CORONAVIRUS 2 (TAT 6-24 HRS)    EKG   Radiology No results found.  Procedures Procedures (including critical care time)  Medications Ordered in UC Medications - No data to display  Initial Impression / Assessment and Plan / UC Course  I have reviewed the triage vital signs and the nursing notes.  Pertinent labs & imaging results that were available during my care of the patient were reviewed by me and considered in my medical decision making (see chart for details).    39 year old male presents with viral respiratory infection.  Strep test negative.  Awaiting Covid test results.  Treating with Atrovent nasal spray and Zyrtec-D.  Supportive care.  Final Clinical Impressions(s) / UC Diagnoses   Final diagnoses:  Viral upper respiratory tract  infection   Discharge Instructions   None    ED Prescriptions    Medication Sig Dispense Auth. Provider   ipratropium (ATROVENT) 0.06 % nasal spray Place 2 sprays into both nostrils 4 (four) times daily as needed for rhinitis. 15 mL Kamarii Buren G, DO   cetirizine-pseudoephedrine (ZYRTEC-D) 5-120 MG tablet Take 1 tablet by mouth 2 (two) times daily. 30 tablet Tommie Sams, DO     PDMP not reviewed this encounter.   Tommie Sams, [place] 05/05/19 [phone]

## 2019-05-05 NOTE — ED Triage Notes (Signed)
Patient c/o cough, sore throat, and runny nose that started after getting his first COVID vaccine on Monday.  Patient denies fevers.

## 2019-05-06 LAB — SARS CORONAVIRUS 2 (TAT 6-24 HRS): SARS Coronavirus 2: NEGATIVE

## 2019-07-26 ENCOUNTER — Ambulatory Visit
Admission: EM | Admit: 2019-07-26 | Discharge: 2019-07-26 | Disposition: A | Discharge status: Home / Self Care | Payer: Managed Care, Other (non HMO) | Attending: Internal Medicine | Admitting: Internal Medicine

## 2019-07-26 ENCOUNTER — Other Ambulatory Visit: Payer: Self-pay

## 2019-07-26 DIAGNOSIS — J208 Acute bronchitis due to other specified organisms: Secondary | ICD-10-CM | POA: Diagnosis not present

## 2019-07-26 MED ORDER — BENZONATATE 200 MG PO CAPS: 200.0000 mg | ORAL_CAPSULE | Freq: Two times a day (BID) | ORAL | 0 refills | Status: DC | PRN

## 2019-07-26 MED ORDER — AZITHROMYCIN 250 MG PO TABS: ORAL_TABLET | ORAL | 0 refills | Status: DC

## 2019-07-26 NOTE — ED Triage Notes (Signed)
Patient complains of cough x 3 weeks with productivity. States that he has been fully vaccinated for Covid.

## 2019-07-26 NOTE — Discharge Instructions (Addendum)
Watch this video who to do saline nose rinses. ° °Https://www.bing.com/videos/search?q=youtube+neti+pot+video&&view=detail&[email or web address]&&FORM=[email or web address] ° ° °Do not use tap water, only boiled water that has been cooled down.  °Do it twice a day  for 5-7 days, but avoid bed time.  ° ° °

## 2019-07-26 NOTE — ED Provider Notes (Addendum)
[facility]    CSN: [id number] Arrival date & time: 07/26/19  3875      History   Chief Complaint Chief Complaint  Patient presents with  . Cough    HPI Jeremy Koch is a 39 y.o. male. who presents with onset of scratchy and itchy throat and cough,  But no nasal symptoms. The cough has continued and got worse last night since the cough kept him awake. Her nephew has been sick who is 1 years, who goes to day care. He denies fever chills or sweats, wheezing or SOB. In the mornings has been spitting up yellow-green mucous.     History reviewed. No pertinent past medical history.  There are no problems to display for this patient.   Past Surgical History:  Procedure Laterality Date  . NO PAST SURGERIES         Home Medications    Prior to Admission medications   Medication Sig Start Date End Date Taking? Authorizing Provider  azithromycin (ZITHROMAX Z-PAK) 250 MG tablet 2 today and then 1 qd x 4 07/26/19   Rodriguez-Southworth, Nettie Elm, PA-C  benzonatate (TESSALON) 200 MG capsule Take 1 capsule (200 mg total) by mouth 2 (two) times daily as needed for cough. 07/26/19   Rodriguez-Southworth, Nettie Elm, PA-C  ipratropium (ATROVENT) 0.06 % nasal spray Place 2 sprays into both nostrils 4 (four) times daily as needed for rhinitis. 05/05/19 07/26/19  Tommie Sams, DO    Family History History reviewed. No pertinent family history.  Social History Social History   Tobacco Use  . Smoking status: Never Smoker  . Smokeless tobacco: Never Used  Vaping Use  . Vaping Use: Never used  Substance Use Topics  . Alcohol use: No  . Drug use: No     Allergies   Patient has no known allergies.   Review of Systems Review of Systems  Constitutional: Negative for activity change, appetite change, chills, diaphoresis, fatigue and fever.  HENT: Positive for postnasal drip. Negative for congestion, ear discharge, ear pain, facial swelling, rhinorrhea, sinus pressure,  sneezing, sore throat and trouble swallowing.   Eyes: Negative for discharge.  Respiratory: Positive for cough. Negative for shortness of breath and wheezing.   Cardiovascular: Negative for chest pain.  Musculoskeletal: Negative for arthralgias and myalgias.  Skin: Negative for rash.  Neurological: Negative for headaches.  Hematological: Negative for adenopathy.     Physical Exam Triage Vital Signs ED Triage Vitals  Enc Vitals Group     BP 07/26/19 0915 (!) 156/96     Pulse Rate 07/26/19 0915 65     Resp 07/26/19 0915 18     Temp 07/26/19 0915 98.3 F (36.8 C)     Temp Source 07/26/19 0915 Oral     SpO2 07/26/19 0915 99 %     Weight 07/26/19 0914 185 lb (83.9 kg)     Height 07/26/19 0914 5\' 7"  (1.702 m)     Head Circumference --      Peak Flow --      Pain Score 07/26/19 0913 10     Pain Loc --      Pain Edu? --      Excl. in [place]? --    No data found.  Updated Vital Signs BP (!) 156/96 (BP Location: Left Arm)   Pulse 65   Temp 98.3 F (36.8 C) (Oral)   Resp 18   Ht 5\' 7"  (1.702 m)   Wt 185 lb (83.9 kg)   SpO2  99%   BMI 28.98 kg/m   Visual Acuity Right Eye Distance:   Left Eye Distance:   Bilateral Distance:    Right Eye Near:   Left Eye Near:    Bilateral Near:     Physical Exam Vitals and nursing note reviewed.  Constitutional:      General: He is not in acute distress.    Appearance: Normal appearance. He is not toxic-appearing.  HENT:     Right Ear: Tympanic membrane, ear canal and external ear normal.     Left Ear: Tympanic membrane, ear canal and external ear normal.     Nose: Nose normal.     Mouth/Throat:     Mouth: Mucous membranes are moist.     Pharynx: Oropharynx is clear.  Eyes:     General: No scleral icterus.    Conjunctiva/sclera: Conjunctivae normal.  Cardiovascular:     Rate and Rhythm: Normal rate and regular rhythm.     Heart sounds: Normal heart sounds. No murmur heard.   Pulmonary:     Effort: Pulmonary effort is normal.      Breath sounds: Normal breath sounds.  Musculoskeletal:        General: Normal range of motion.     Cervical back: Neck supple.  Lymphadenopathy:     Cervical: No cervical adenopathy.  Skin:    General: Skin is warm and dry.  Neurological:     Mental Status: He is alert and oriented to person, place, and time.     Gait: Gait normal.  Psychiatric:        Mood and Affect: Mood normal.        Behavior: Behavior normal.        Thought Content: Thought content normal.        Judgment: Judgment normal.      UC Treatments / Results  Labs (all labs ordered are listed, but only abnormal results are displayed) Labs Reviewed - No data to display  EKG   Radiology No results found.  Procedures Procedures (including critical care time)  Medications Ordered in UC Medications - No data to display  Initial Impression / Assessment and Plan / UC Course  I have reviewed the triage vital signs and the nursing notes. He may possibly have mycoplasma bronchitis and I placed him on Zpack and Tessalon as noted.  Final Clinical Impressions(s) / UC Diagnoses   Final diagnoses:  Acute bronchitis due to other specified organisms     Discharge Instructions     Watch this video who to do saline nose rinses.  [email or web address]   Do not use tap water, only boiled water that has been cooled down.  Do it twice a day  for 5-7 days, but avoid bed time.       ED Prescriptions    Medication Sig Dispense Auth. Provider   azithromycin (ZITHROMAX Z-PAK) 250 MG tablet 2 today and then 1 qd x 4 6 tablet Rodriguez-Southworth, Avira Tillison, PA-C   benzonatate (TESSALON) 200 MG capsule Take 1 capsule (200 mg total) by mouth 2 (two) times daily as needed for cough. 30 capsule Rodriguez-Southworth, Nettie Elm, PA-C     PDMP not reviewed this encounter.   Garey Ham,  PA-C 07/26/19 0932    Rodriguez-Southworth, Nettie Elm, PA-C 07/26/19 [phone]

## 2019-08-16 ENCOUNTER — Other Ambulatory Visit: Payer: Self-pay

## 2019-08-16 ENCOUNTER — Emergency Department
Admission: EM | Admit: 2019-08-16 | Discharge: 2019-08-16 | Disposition: A | Discharge status: Home / Self Care | Payer: Managed Care, Other (non HMO) | Attending: Emergency Medicine | Admitting: Emergency Medicine

## 2019-08-16 DIAGNOSIS — R197 Diarrhea, unspecified: Secondary | ICD-10-CM | POA: Insufficient documentation

## 2019-08-16 DIAGNOSIS — R112 Nausea with vomiting, unspecified: Secondary | ICD-10-CM | POA: Insufficient documentation

## 2019-08-16 LAB — GASTROINTESTINAL PANEL BY PCR, STOOL (REPLACES STOOL CULTURE)

## 2019-08-16 LAB — CBC
HCT: 42.9 % (ref 39.0–52.0)
Hemoglobin: 14.8 g/dL (ref 13.0–17.0)
MCH: 28.7 pg (ref 26.0–34.0)
MCHC: 34.5 g/dL (ref 30.0–36.0)
MCV: 83.3 fL (ref 80.0–100.0)
Platelets: 204 10*3/uL (ref 150–400)
RBC: 5.15 MIL/uL (ref 4.22–5.81)
RDW: 12.1 % (ref 11.5–15.5)
WBC: 5.1 10*3/uL (ref 4.0–10.5)
nRBC: 0 % (ref 0.0–0.2)

## 2019-08-16 LAB — COMPREHENSIVE METABOLIC PANEL
ALT: 26 U/L (ref 0–44)
AST: 28 U/L (ref 15–41)
Albumin: 4.4 g/dL (ref 3.5–5.0)
Alkaline Phosphatase: 84 U/L (ref 38–126)
Anion gap: 10 (ref 5–15)
BUN: 15 mg/dL (ref 6–20)
CO2: 24 mmol/L (ref 22–32)
Calcium: 9.1 mg/dL (ref 8.9–10.3)
Chloride: 102 mmol/L (ref 98–111)
Creatinine, Ser: 0.97 mg/dL (ref 0.61–1.24)
GFR calc Af Amer: >60 mL/min (ref >60)
GFR calc non Af Amer: >60 mL/min (ref >60)
Glucose, Bld: 91 mg/dL (ref 70–99)
Potassium: 3.7 mmol/L (ref 3.5–5.1)
Sodium: 136 mmol/L (ref 135–145)
Total Bilirubin: 0.8 mg/dL (ref 0.3–1.2)
Total Protein: 7.9 g/dL (ref 6.5–8.1)

## 2019-08-16 MED ORDER — DICYCLOMINE HCL 20 MG PO TABS: 20.0000 mg | ORAL_TABLET | Freq: Three times a day (TID) | ORAL | 0 refills | Status: DC

## 2019-08-16 MED ORDER — ONDANSETRON 4 MG PO TBDP: 4.0000 mg | ORAL_TABLET | Freq: Three times a day (TID) | ORAL | 0 refills | Status: DC | PRN

## 2019-08-16 MED ORDER — LACTATED RINGERS IV BOLUS
1000.0000 mL | Freq: Once | INTRAVENOUS | Status: AC
  Administered 2019-08-16: 1000 mL via INTRAVENOUS

## 2019-08-16 MED ORDER — DICYCLOMINE HCL 10 MG PO CAPS
20.0000 mg | ORAL_CAPSULE | Freq: Once | ORAL | Status: AC
  Administered 2019-08-16: 20 mg via ORAL
  Filled 2019-08-16: qty 2

## 2019-08-16 NOTE — ED Triage Notes (Signed)
Pt states diarrhea and nausea since Sunday. Pt states has been seen at [facility] for same two times since Sunday. the patient appears in no acute distress, denies pain, fever.

## 2019-08-16 NOTE — Discharge Instructions (Addendum)
We will call you with any results that need to be treated differently  Continue to drink plenty of fluids  Take the prescribed medications for stomach/intestinal spasms and nausea if needed

## 2019-08-16 NOTE — ED Provider Notes (Signed)
[facility] Emergency Department Provider Note  ____________________________________________   First MD Initiated Contact with Patient 08/16/19 1742     (approximate)  I have reviewed the triage vital signs and the nursing notes.   HISTORY  Chief Complaint Diarrhea    HPI Jeremy Koch is a 39 y.o. male  Here with watery diarrhea. Pt states that he was at a wedding last weekend, around >160 people. He believes some of the small children at the wedding were sick. The next day, he developed mild, epigastric abd cramping along with nausea, vomting, and diarrhea. His pain, n/v have resolved but he has had ongoing diarrhea. He has been taking immodium with mild relief. He states that every time he tries to eat, he gets the urge and has to run to the bathroom. No h/o prior abdominal issues/infections. No recent travel outside of the [place]. No suspicious food intake. Pt has been seen twice at [facility] for these sx w/o diagnosis.        No past medical history on file.  There are no problems to display for this patient.   Past Surgical History:  Procedure Laterality Date  . NO PAST SURGERIES      Prior to Admission medications   Medication Sig Start Date End Date Taking? Authorizing Provider  ondansetron (ZOFRAN) 4 MG tablet Take 4 mg by mouth every 8 (eight) hours as needed for nausea or vomiting.   Yes [provider]  dicyclomine (BENTYL) 20 MG tablet Take 1 tablet (20 mg total) by mouth 4 (four) times daily -  before meals and at bedtime for 5 days. 08/16/19 08/21/19  Shaune Pollack, MD  ondansetron (ZOFRAN ODT) 4 MG disintegrating tablet Take 1 tablet (4 mg total) by mouth every 8 (eight) hours as needed for nausea or vomiting. 08/16/19   Shaune Pollack, MD  ipratropium (ATROVENT) 0.06 % nasal spray Place 2 sprays into both nostrils 4 (four) times daily as needed for rhinitis. 05/05/19 07/26/19  Tommie Sams, DO    Allergies Patient has no known  allergies.  No family history on file.  Social History Social History   Tobacco Use  . Smoking status: Never Smoker  . Smokeless tobacco: Never Used  Vaping Use  . Vaping Use: Never used  Substance Use Topics  . Alcohol use: No  . Drug use: No    Review of Systems  Review of Systems  Constitutional: Positive for fatigue. Negative for chills and fever.  HENT: Negative for sore throat.   Respiratory: Negative for shortness of breath.   Cardiovascular: Negative for chest pain.  Gastrointestinal: Positive for abdominal pain and diarrhea.  Genitourinary: Negative for flank pain.  Musculoskeletal: Negative for neck pain.  Skin: Negative for rash and wound.  Allergic/Immunologic: Negative for immunocompromised state.  Neurological: Positive for weakness. Negative for numbness.  Hematological: Does not bruise/bleed easily.  All other systems reviewed and are negative.    ____________________________________________  PHYSICAL EXAM:      VITAL SIGNS: ED Triage Vitals  Enc Vitals Group     BP 08/16/19 1441 (!) 146/93     Pulse Rate 08/16/19 1441 70     Resp 08/16/19 1441 17     Temp 08/16/19 1441 98.9 F (37.2 C)     Temp Source 08/16/19 1441 Oral     SpO2 08/16/19 1441 100 %     Weight 08/16/19 1503 178 lb (80.7 kg)     Height 08/16/19 1503 5\' 7"  (1.702 m)  Head Circumference --      Peak Flow --      Pain Score 08/16/19 1503 0     Pain Loc --      Pain Edu? --      Excl. in [place]? --      Physical Exam Vitals and nursing note reviewed.  Constitutional:      General: He is not in acute distress.    Appearance: He is well-developed.  HENT:     Head: Normocephalic and atraumatic.     Mouth/Throat:     Mouth: Mucous membranes are dry.  Eyes:     Conjunctiva/sclera: Conjunctivae normal.  Cardiovascular:     Rate and Rhythm: Normal rate and regular rhythm.     Heart sounds: Normal heart sounds. No murmur heard.  No friction rub.  Pulmonary:     Effort:  Pulmonary effort is normal. No respiratory distress.     Breath sounds: Normal breath sounds. No wheezing or rales.  Abdominal:     General: There is no distension.     Palpations: Abdomen is soft.     Tenderness: There is no abdominal tenderness.  Musculoskeletal:     Cervical back: Neck supple.  Skin:    General: Skin is warm.     Capillary Refill: Capillary refill takes less than 2 seconds.  Neurological:     Mental Status: He is alert and oriented to person, place, and time.     Motor: No abnormal muscle tone.       ____________________________________________   LABS (all labs ordered are listed, but only abnormal results are displayed)  Labs Reviewed  GASTROINTESTINAL PANEL BY PCR, STOOL (REPLACES STOOL CULTURE) - Abnormal; Notable for the following components:      Result Value   Astrovirus DETECTED (*)    All other components within normal limits  COMPREHENSIVE METABOLIC PANEL  CBC  HIV ANTIBODY (ROUTINE TESTING W REFLEX)    ____________________________________________  EKG: None ________________________________________  RADIOLOGY All imaging, including plain films, CT scans, and ultrasounds, independently reviewed by me, and interpretations confirmed via formal radiology reads.  ED MD interpretation:   None  Official radiology report(s): No results found.  ____________________________________________  PROCEDURES   Procedure(s) performed (including Critical Care):  Procedures  ____________________________________________  INITIAL IMPRESSION / MDM / ASSESSMENT AND PLAN / ED COURSE  As part of my medical decision making, I reviewed the following data within the electronic MEDICAL RECORD NUMBER Nursing notes reviewed and incorporated, Old chart reviewed, Notes from prior ED visits, and [place] Controlled Substance Database       *Jeremy Koch was evaluated in Emergency Department on 08/16/2019 for the symptoms described in the history of present  illness. He was evaluated in the context of the global COVID-19 pandemic, which necessitated consideration that the patient might be at risk for infection with the SARS-CoV-2 virus that causes COVID-19. Institutional protocols and algorithms that pertain to the evaluation of patients at risk for COVID-19 are in a state of rapid change based on information released by regulatory bodies including the CDC and federal and state organizations. These policies and algorithms were followed during the patient's care in the ED.  Some ED evaluations and interventions may be delayed as a result of limited staffing during the pandemic.*     Medical Decision Making:  39 yo M here with persistent diarrhea. On exam, pt is mildly dehydrated but otherwise very well appearing, and abdomen is soft, NT, ND. No focal TTP to suggest  cholecystitis, appendicitis, SBO, or acute abdominal emergency. Lab work is overall reassuring - normal WBC, renal function, and LFTs. Serial abd exams benign. Pt given fluids and feels better in ED. Will send GI panel given the duration of his sx, encourage ongoing supportive care, and good return precautions.  ____________________________________________  FINAL CLINICAL IMPRESSION(S) / ED DIAGNOSES  Final diagnoses:  Diarrhea of presumed infectious origin     MEDICATIONS GIVEN DURING THIS VISIT:  Medications  lactated ringers bolus 1,000 mL (0 mLs Intravenous Stopped 08/16/19 2038)  dicyclomine (BENTYL) capsule 20 mg (20 mg Oral Given 08/16/19 2039)     ED Discharge Orders         Ordered    dicyclomine (BENTYL) 20 MG tablet  3 times daily before meals & bedtime     Discontinue  Reprint     08/16/19 2041    ondansetron (ZOFRAN ODT) 4 MG disintegrating tablet  Every 8 hours PRN     Discontinue  Reprint     08/16/19 2041           Note:  This document was prepared using Dragon voice recognition software and may include unintentional dictation errors.   Shaune Pollack,  MD 08/16/19 2244

## 2019-08-16 NOTE — ED Notes (Signed)
See triage note  Presents with some diarrhea  States he developed some nausea and diarrhea on Sunday   Went to MD on Tuesday    Was given bentyl and zofran  States nausea is better  But has had 3 loose stools today

## 2019-08-17 LAB — HIV ANTIBODY (ROUTINE TESTING W REFLEX): HIV Screen 4th Generation wRfx: NONREACTIVE

## 2019-09-10 ENCOUNTER — Ambulatory Visit
Admission: EM | Admit: 2019-09-10 | Discharge: 2019-09-10 | Disposition: A | Discharge status: Home / Self Care | Payer: 59 | Attending: Physician Assistant | Admitting: Physician Assistant

## 2019-09-10 ENCOUNTER — Other Ambulatory Visit: Payer: Self-pay

## 2019-09-10 DIAGNOSIS — K047 Periapical abscess without sinus: Secondary | ICD-10-CM | POA: Diagnosis not present

## 2019-09-10 DIAGNOSIS — K0889 Other specified disorders of teeth and supporting structures: Secondary | ICD-10-CM

## 2019-09-10 MED ORDER — ACETAMINOPHEN 500 MG PO TABS: 1000.0000 mg | ORAL_TABLET | Freq: Three times a day (TID) | ORAL | 0 refills | Status: DC | PRN

## 2019-09-10 MED ORDER — AMOXICILLIN-POT CLAVULANATE 875-125 MG PO TABS
1.0000 | ORAL_TABLET | Freq: Two times a day (BID) | ORAL | 0 refills | Status: AC
Start: 2019-09-10 — End: 2019-09-20

## 2019-09-10 MED ORDER — CHLORHEXIDINE GLUCONATE 0.12 % MT SOLN: 15.0000 mL | Freq: Two times a day (BID) | OROMUCOSAL | 0 refills | Status: DC

## 2019-09-10 MED ORDER — IBUPROFEN 800 MG PO TABS: 800.0000 mg | ORAL_TABLET | Freq: Three times a day (TID) | ORAL | 0 refills | Status: DC

## 2019-09-10 NOTE — ED Triage Notes (Addendum)
Patient in today w/ c/o of dental pain. Patient states sx onset was a month ago and is progressively worse. Patient also states he was unable to sleep last night due to pain in affected tooth. Sx on Left side of face and patient states headache as well.

## 2019-09-10 NOTE — ED Provider Notes (Signed)
[facility]    CSN: [id number] Arrival date & time: 09/10/19  0849      History   Chief Complaint Chief Complaint  Patient presents with  . Dental Pain    HPI Jeremy Koch is a 39 y.o. male.   Reports for left-sided dental pain.  Reports pain on the upper and lower teeth.  Reports has been going on a low level for about a month but became more pain for the last 48 hours.  Reports painful to chew on the left side.  Has been able to eat and drink however.  No pain with swallowing.  Reports a little bit of left-sided facial swelling.  Has not seen a dentist for this.  No other complaints today.     History reviewed. No pertinent past medical history.  There are no problems to display for this patient.   Past Surgical History:  Procedure Laterality Date  . NO PAST SURGERIES         Home Medications    Prior to Admission medications   Medication Sig Start Date End Date Taking? Authorizing Provider  acetaminophen (TYLENOL) 500 MG tablet Take 2 tablets (1,000 mg total) by mouth every 8 (eight) hours as needed. 09/10/19   Phinehas Grounds, Veryl Speak, PA-C  amoxicillin-clavulanate (AUGMENTIN) 875-125 MG tablet Take 1 tablet by mouth every 12 (twelve) hours for 10 days. 09/10/19 09/20/19  Lou Loewe, Veryl Speak, PA-C  chlorhexidine (PERIDEX) 0.12 % solution Use as directed 15 mLs in the mouth or throat 2 (two) times daily. 09/10/19   Shameeka Silliman, Veryl Speak, PA-C  dicyclomine (BENTYL) 20 MG tablet Take 1 tablet (20 mg total) by mouth 4 (four) times daily -  before meals and at bedtime for 5 days. 08/16/19 08/21/19  Shaune Pollack, MD  ibuprofen (ADVIL) 800 MG tablet Take 1 tablet (800 mg total) by mouth 3 (three) times daily. 09/10/19   Collin Hendley, Veryl Speak, PA-C  ondansetron (ZOFRAN ODT) 4 MG disintegrating tablet Take 1 tablet (4 mg total) by mouth every 8 (eight) hours as needed for nausea or vomiting. 08/16/19   Shaune Pollack, MD  ondansetron (ZOFRAN) 4 MG tablet Take 4 mg by mouth every 8 (eight) hours as  needed for nausea or vomiting.    [provider]  ipratropium (ATROVENT) 0.06 % nasal spray Place 2 sprays into both nostrils 4 (four) times daily as needed for rhinitis. 05/05/19 07/26/19  Tommie Sams, DO    Family History History reviewed. No pertinent family history.  Social History Social History   Tobacco Use  . Smoking status: Never Smoker  . Smokeless tobacco: Never Used  Vaping Use  . Vaping Use: Never used  Substance Use Topics  . Alcohol use: No  . Drug use: No     Allergies   Patient has no known allergies.   Review of Systems Review of Systems   Physical Exam Triage Vital Signs ED Triage Vitals  Enc Vitals Group     BP 09/10/19 1003 (!) 187/107     Pulse Rate 09/10/19 1003 85     Resp 09/10/19 1003 16     Temp 09/10/19 1003 98.6 F (37 C)     Temp Source 09/10/19 1003 Oral     SpO2 09/10/19 1003 99 %     Weight 09/10/19 1006 185 lb (83.9 kg)     Height 09/10/19 1006 5\' 7"  (1.702 m)     Head Circumference --      Peak Flow --  Pain Score 09/10/19 1005 10     Pain Loc --      Pain Edu? --      Excl. in GC? --    No data found.  Updated Vital Signs BP (!) 187/107 (BP Location: Right Arm)   Pulse 85   Temp 98.6 F (37 C) (Oral)   Resp 16   Ht 5\' 7"  (1.702 m)   Wt 185 lb (83.9 kg)   SpO2 99%   BMI 28.98 kg/m   Visual Acuity Right Eye Distance:   Left Eye Distance:   Bilateral Distance:    Right Eye Near:   Left Eye Near:    Bilateral Near:     Physical Exam Vitals and nursing note reviewed.  Constitutional:      Appearance: Normal appearance.  HENT:     Mouth/Throat:     Pharynx: Oropharynx is clear. Uvula midline. No pharyngeal swelling, oropharyngeal exudate, posterior oropharyngeal erythema or uvula swelling.      Comments: There are several missing molars.  Left lower mandible with missing molars and gingival swelling around molar with some evidence of decay.  There is some swelling of the buccal mucosa on the  left side, no tenderness directly over the left parotid gland.  Parotid duct appears patent without obvious stone.  There is some tenderness of the left mandible and submandibular area.  No appreciable mass or swelling.  No submental swelling  Controlling secretions.  No trismus. Neurological:     Mental Status: He is alert.      UC Treatments / Results  Labs (all labs ordered are listed, but only abnormal results are displayed) Labs Reviewed - No data to display  EKG   Radiology No results found.  Procedures Procedures (including critical care time)  Medications Ordered in UC Medications - No data to display  Initial Impression / Assessment and Plan / UC Course  I have reviewed the triage vital signs and the nursing notes.  Pertinent labs & imaging results that were available during my care of the patient were reviewed by me and considered in my medical decision making (see chart for details).     ##Dental pain #Dental infection Patient is a 39 year old presenting with dental pain and infection.  Given gingival swelling and dentition, most likely there is odontogenic infection.,  However there is some buccal swelling in the location of the parotid, no significant tenderness over the direct distribution of the parotid however this may be contributing symptoms.  We will treat with Augmentin,, ibuprofen and Tylenol.  Recommend good hydration and oral lozenges.  Dental resources given.  Encouraged follow-up of established with primary care.  Return, follow-up and emergency department precautions were discussed.  Patient verbalized understanding Final Clinical Impressions(s) / UC Diagnoses   Final diagnoses:  Pain, dental  Dental infection     Discharge Instructions     Take the medicines as prescribed Use mouthwash Hydrate well Follow-up with a dentist as soon as possible.  Use hard candies or sore throat lozenges  Establish with a primary care  Specifically  worsening or not improving over the next several days return to this clinic or follow with your primary care        ED Prescriptions    Medication Sig Dispense Auth. Provider   amoxicillin-clavulanate (AUGMENTIN) 875-125 MG tablet Take 1 tablet by mouth every 12 (twelve) hours for 10 days. 20 tablet Iseah Plouff, 20, PA-C   ibuprofen (ADVIL) 800 MG tablet Take 1 tablet (  800 mg total) by mouth 3 (three) times daily. 21 tablet Dartanian Knaggs, Veryl Speak, PA-C   acetaminophen (TYLENOL) 500 MG tablet Take 2 tablets (1,000 mg total) by mouth every 8 (eight) hours as needed. 30 tablet Willem Klingensmith, Veryl Speak, PA-C   chlorhexidine (PERIDEX) 0.12 % solution Use as directed 15 mLs in the mouth or throat 2 (two) times daily. 120 mL Shakeira Rhee, Veryl Speak, PA-C     PDMP not reviewed this encounter.   Hermelinda Medicus, PA-C 09/10/19 1135

## 2019-09-10 NOTE — Discharge Instructions (Signed)
Take the medicines as prescribed Use mouthwash Hydrate well Follow-up with a dentist as soon as possible.  Use hard candies or sore throat lozenges  Establish with a primary care  Specifically worsening or not improving over the next several days return to this clinic or follow with your primary care

## 2019-09-11 ENCOUNTER — Encounter: Payer: Self-pay | Admitting: Emergency Medicine

## 2019-09-11 ENCOUNTER — Ambulatory Visit
Admission: EM | Admit: 2019-09-11 | Discharge: 2019-09-11 | Disposition: A | Discharge status: Home / Self Care | Payer: 59 | Attending: Family Medicine | Admitting: Family Medicine

## 2019-09-11 ENCOUNTER — Other Ambulatory Visit: Payer: Self-pay

## 2019-09-11 DIAGNOSIS — K0889 Other specified disorders of teeth and supporting structures: Secondary | ICD-10-CM | POA: Diagnosis not present

## 2019-09-11 MED ORDER — HYDROCODONE-ACETAMINOPHEN 5-325 MG PO TABS: 2.0000 | ORAL_TABLET | ORAL | 0 refills | Status: DC | PRN

## 2019-09-11 NOTE — ED Triage Notes (Signed)
Patient c/o left side dental pain that started about 1 month ago. He states the medications are not working for the pain.

## 2019-09-11 NOTE — Discharge Instructions (Addendum)
You were seen at the Urgent Care for persistent dental pain. Please pick up your prescriptions at your pharmacy. Be sure to take ibuprofen and antibiotics as previously prescribed . Follow up with your PCP as needed.   If you haven't already, sign up for My Chart to have easy access to your labs results, and communication with your primary care physician.  Dr. Rachael Darby

## 2019-09-11 NOTE — ED Provider Notes (Signed)
[facility]    CSN: [id number] Arrival date & time: 09/11/19  1136      History   Chief Complaint Chief Complaint  Patient presents with   Dental Pain    HPI Jeremy Koch is a 39 y.o. male.   HPI   Patient reports bottom right dental pain with associated headache for the past month. Pain worse as he is unable to sleep at night.  Taking ibuprofen, tylenol, mouth wash and Augmentin.  Called a dentist who cant see the patient until Thursday morning.   History reviewed. No pertinent past medical history.  There are no problems to display for this patient.   Past Surgical History:  Procedure Laterality Date   NO PAST SURGERIES        Home Medications    Prior to Admission medications   Medication Sig Start Date End Date Taking? Authorizing Provider  acetaminophen (TYLENOL) 500 MG tablet Take 2 tablets (1,000 mg total) by mouth every 8 (eight) hours as needed. 09/10/19  Yes Darr, Veryl Speak, PA-C  amoxicillin-clavulanate (AUGMENTIN) 875-125 MG tablet Take 1 tablet by mouth every 12 (twelve) hours for 10 days. 09/10/19 09/20/19 Yes Darr, Veryl Speak, PA-C  chlorhexidine (PERIDEX) 0.12 % solution Use as directed 15 mLs in the mouth or throat 2 (two) times daily. 09/10/19  Yes Darr, Veryl Speak, PA-C  ibuprofen (ADVIL) 800 MG tablet Take 1 tablet (800 mg total) by mouth 3 (three) times daily. 09/10/19  Yes Darr, Veryl Speak, PA-C  ondansetron (ZOFRAN ODT) 4 MG disintegrating tablet Take 1 tablet (4 mg total) by mouth every 8 (eight) hours as needed for nausea or vomiting. 08/16/19  Yes Shaune Pollack, MD  ondansetron (ZOFRAN) 4 MG tablet Take 4 mg by mouth every 8 (eight) hours as needed for nausea or vomiting.   Yes [provider]  dicyclomine (BENTYL) 20 MG tablet Take 1 tablet (20 mg total) by mouth 4 (four) times daily -  before meals and at bedtime for 5 days. 08/16/19 08/21/19  Shaune Pollack, MD  HYDROcodone-acetaminophen (NORCO/VICODIN) 5-325 MG tablet Take 2 tablets  by mouth every 4 (four) hours as needed. 09/11/19   Ariv Penrod, Seward Meth, DO  ipratropium (ATROVENT) 0.06 % nasal spray Place 2 sprays into both nostrils 4 (four) times daily as needed for rhinitis. 05/05/19 07/26/19  Tommie Sams, DO    Family History Family History  Problem Relation Age of Onset   Healthy Mother    Healthy Father     Social History Social History   Tobacco Use   Smoking status: Never Smoker   Smokeless tobacco: Never Used  Building services engineer Use: Never used  Substance Use Topics   Alcohol use: No   Drug use: No     Allergies   Patient has no known allergies.   Review of Systems Review of Systems: See HPI    Physical Exam Triage Vital Signs ED Triage Vitals  Enc Vitals Group     BP 09/11/19 1310 (!) 168/106     Pulse Rate 09/11/19 1310 96     Resp 09/11/19 1310 18     Temp 09/11/19 1310 98.9 F (37.2 C)     Temp Source 09/11/19 1310 Oral     SpO2 09/11/19 1310 100 %     Weight 09/11/19 1309 184 lb 15.5 oz (83.9 kg)     Height 09/11/19 1309 5\' 7"  (1.702 m)     Head Circumference --  Peak Flow --      Pain Score 09/11/19 1309 10     Pain Loc --      Pain Edu? --      Excl. in GC? --    No data found.  Updated Vital Signs BP (!) 168/106 (BP Location: Right Arm)    Pulse 96    Temp 98.9 F (37.2 C) (Oral)    Resp 18    Ht 5\' 7"  (1.702 m)    Wt 184 lb 15.5 oz (83.9 kg)    SpO2 100%    BMI 28.97 kg/m   Visual Acuity Right Eye Distance:   Left Eye Distance:   Bilateral Distance:    Right Eye Near:   Left Eye Near:    Bilateral Near:     Physical Exam Vitals and nursing note reviewed.  Constitutional:      Appearance: Normal appearance.  HENT:     Head: Normocephalic and atraumatic.     Right Ear: Tympanic membrane normal.     Left Ear: Tympanic membrane normal.     Nose: Nose normal.     Mouth/Throat:     Mouth: Mucous membranes are moist.     Palate: No lesions.     Pharynx: Oropharynx is clear. No oropharyngeal exudate.       Comments: No appreciable  erythema, dental abscess, lesion or infection. There is swelling to right lower jaw.  Neurological:     Mental Status: He is alert.     Left lower mandible: Tender buccal swelling without overlying erythema.  No fluctuance appreciated.  Left lower jaw missing several molars.  No overt evidence of tooth decay.  No periauricular or perimandibular lymphadenopathy.  UC Treatments / Results  Labs (all labs ordered are listed, but only abnormal results are displayed) Labs Reviewed - No data to display  EKG   Radiology No results found.  Procedures Procedures (including critical care time)  Medications Ordered in UC Medications - No data to display  Initial Impression / Assessment and Plan / UC Course  I have reviewed the triage vital signs and the nursing notes.  Pertinent labs & imaging results that were available during my care of the patient were reviewed by me and considered in my medical decision making (see chart for details).      Dental pain:  Patient presenting for follow-up uncontrolled pain that is likely dental in origin.  Reports having a upcoming dental appointment later this week.  Has been taking Augmentin, ibuprofen and Tylenol for pain.  Continues to have breakthrough pain that is disturbing his sleep.  Encouraged patient to keep follow-up appointment.  Treat severe pain with Norco. PDMP reviewed during this encounter. Patient aware he is not to receive additional opioids. Patient agrees with plan.  .  Final Clinical Impressions(s) / UC Diagnoses   Final diagnoses:  Pain, dental     Discharge Instructions     You were seen at the Urgent Care for persistent dental pain. Please pick up your prescriptions at your pharmacy. Be sure to take ibuprofen and antibiotics as previously prescribed . Follow up with your PCP as needed.   If you haven't already, sign up for My Chart to have easy access to your labs results, and communication  with your primary care physician.  Dr.      ED Prescriptions    Medication Sig Dispense Auth. Provider   HYDROcodone-acetaminophen (NORCO/VICODIN) 5-325 MG tablet Take 2 tablets by mouth every 4 (  four) hours as needed. 10 tablet Katha Cabal, DO     I have reviewed the PDMP during this encounter.   Katha Cabal, DO 09/11/19 1433

## 2020-11-08 ENCOUNTER — Ambulatory Visit
Admission: EM | Admit: 2020-11-08 | Discharge: 2020-11-08 | Disposition: A | Discharge status: Home / Self Care | Payer: Medicaid Other | Attending: Emergency Medicine | Admitting: Emergency Medicine

## 2020-11-08 ENCOUNTER — Encounter: Payer: Self-pay | Admitting: Emergency Medicine

## 2020-11-08 ENCOUNTER — Other Ambulatory Visit: Payer: Self-pay

## 2020-11-08 DIAGNOSIS — U071 COVID-19: Secondary | ICD-10-CM | POA: Diagnosis not present

## 2020-11-08 DIAGNOSIS — J069 Acute upper respiratory infection, unspecified: Secondary | ICD-10-CM

## 2020-11-08 LAB — GROUP A STREP BY PCR: Group A Strep by PCR: NOT DETECTED

## 2020-11-08 MED ORDER — PROMETHAZINE-PHENYLEPHRINE 6.25-5 MG/5ML PO SYRP: 5.0000 mL | ORAL_SOLUTION | Freq: Four times a day (QID) | ORAL | 0 refills | Status: DC | PRN

## 2020-11-08 MED ORDER — IPRATROPIUM BROMIDE 0.06 % NA SOLN: 2.0000 | Freq: Four times a day (QID) | NASAL | 12 refills | Status: DC

## 2020-11-08 MED ORDER — BENZONATATE 100 MG PO CAPS: 200.0000 mg | ORAL_CAPSULE | Freq: Three times a day (TID) | ORAL | 0 refills | Status: DC

## 2020-11-08 NOTE — Discharge Instructions (Signed)
Use the Atrovent nasal spray, 2 squirts in each nostril every 6 hours, as needed for runny nose and postnasal drip. ° °Use the Tessalon Perles every 8 hours during the day.  Take them with a small sip of water.  They may give you some numbness to the base of your tongue or a metallic taste in your mouth, this is normal. ° °Use the Promethazine VC cough syrup at bedtime for cough and congestion.  It will make you drowsy so do not take it during the day. ° °Return for reevaluation or see your primary care provider for any new or worsening symptoms.  °

## 2020-11-08 NOTE — ED Provider Notes (Signed)
[facility]    CSN: [id number] Arrival date & time: 11/08/20  1002      History   Chief Complaint Chief Complaint  Patient presents with   Nasal Congestion   Sore Throat   Headache    HPI Jeremy Koch is a 40 y.o. male.   HPI  40 year old male here for evaluation of respiratory complaints.  Patient reports that for the last 2 days he has been experiencing headache, nasal congestion, ear pain, sore throat, and a nonproductive cough.  He did have another coworker go home the day his symptoms began but he is unsure what she had.  He denies fever, runny nose nasal discharge, shortness with wheezing, or GI complaints.  History reviewed. No pertinent past medical history.  There are no problems to display for this patient.   Past Surgical History:  Procedure Laterality Date   NO PAST SURGERIES         Home Medications    Prior to Admission medications   Medication Sig Start Date End Date Taking? Authorizing Provider  benzonatate (TESSALON) 100 MG capsule Take 2 capsules (200 mg total) by mouth every 8 (eight) hours. 11/08/20  Yes Becky Augusta, NP  ipratropium (ATROVENT) 0.06 % nasal spray Place 2 sprays into both nostrils 4 (four) times daily. 11/08/20  Yes Becky Augusta, NP  promethazine-phenylephrine (PROMETHAZINE VC) 6.25-5 MG/5ML SYRP Take 5 mLs by mouth every 6 (six) hours as needed for congestion. 11/08/20  Yes Becky Augusta, NP  acetaminophen (TYLENOL) 500 MG tablet Take 2 tablets (1,000 mg total) by mouth every 8 (eight) hours as needed. 09/10/19   Darr, Gerilyn Pilgrim, PA-C  chlorhexidine (PERIDEX) 0.12 % solution Use as directed 15 mLs in the mouth or throat 2 (two) times daily. 09/10/19   Darr, Gerilyn Pilgrim, PA-C  dicyclomine (BENTYL) 20 MG tablet Take 1 tablet (20 mg total) by mouth 4 (four) times daily -  before meals and at bedtime for 5 days. 08/16/19 08/21/19  Shaune Pollack, MD  HYDROcodone-acetaminophen (NORCO/VICODIN) 5-325 MG tablet Take 2 tablets by mouth  every 4 (four) hours as needed. 09/11/19   Brimage, Seward Meth, DO  ibuprofen (ADVIL) 800 MG tablet Take 1 tablet (800 mg total) by mouth 3 (three) times daily. 09/10/19   Darr, Gerilyn Pilgrim, PA-C  ondansetron (ZOFRAN ODT) 4 MG disintegrating tablet Take 1 tablet (4 mg total) by mouth every 8 (eight) hours as needed for nausea or vomiting. 08/16/19   Shaune Pollack, MD  ondansetron (ZOFRAN) 4 MG tablet Take 4 mg by mouth every 8 (eight) hours as needed for nausea or vomiting.    [provider]    Family History Family History  Problem Relation Age of Onset   Healthy Mother    Healthy Father     Social History Social History   Tobacco Use   Smoking status: Never   Smokeless tobacco: Never  Vaping Use   Vaping Use: Never used  Substance Use Topics   Alcohol use: No   Drug use: No     Allergies   Patient has no known allergies.   Review of Systems Review of Systems  Constitutional:  Negative for activity change, appetite change and fever.  HENT:  Positive for congestion, ear pain and sore throat. Negative for rhinorrhea.   Respiratory:  Positive for cough. Negative for shortness of breath and wheezing.   Gastrointestinal:  Negative for diarrhea, nausea and vomiting.  Musculoskeletal:  Negative for arthralgias and myalgias.  Skin:  Negative for rash.  Neurological:  Positive for headaches.  Hematological: Negative.   Psychiatric/Behavioral: Negative.      Physical Exam Triage Vital Signs ED Triage Vitals  Enc Vitals Group     BP 11/08/20 1101 (!) 154/94     Pulse Rate 11/08/20 1101 73     Resp 11/08/20 1101 16     Temp 11/08/20 1101 98.6 F (37 C)     Temp Source 11/08/20 1101 Oral     SpO2 11/08/20 1101 99 %     Weight 11/08/20 1058 185 lb (83.9 kg)     Height 11/08/20 1058 5\' 7"  (1.702 m)     Head Circumference --      Peak Flow --      Pain Score 11/08/20 1058 5     Pain Loc --      Pain Edu? --      Excl. in GC? --    No data found.  Updated Vital  Signs BP (!) 154/94 (BP Location: Left Arm)   Pulse 73   Temp 98.6 F (37 C) (Oral)   Resp 16   Ht 5\' 7"  (1.702 m)   Wt 185 lb (83.9 kg)   SpO2 99%   BMI 28.98 kg/m   Visual Acuity Right Eye Distance:   Left Eye Distance:   Bilateral Distance:    Right Eye Near:   Left Eye Near:    Bilateral Near:     Physical Exam Vitals and nursing note reviewed.  Constitutional:      General: He is not in acute distress.    Appearance: Normal appearance. He is not ill-appearing.  HENT:     Head: Normocephalic and atraumatic.     Right Ear: Tympanic membrane, ear canal and external ear normal. There is no impacted cerumen.     Left Ear: Ear canal and external ear normal. There is no impacted cerumen.     Ears:     Comments: Left TM is mildly erythematous but there is no effusion, bulging, or loss of landmarks.    Nose: Congestion and rhinorrhea present.     Mouth/Throat:     Mouth: Mucous membranes are moist.     Pharynx: Oropharynx is clear. Posterior oropharyngeal erythema present.  Cardiovascular:     Rate and Rhythm: Normal rate and regular rhythm.     Pulses: Normal pulses.     Heart sounds: Normal heart sounds. No murmur heard.   No gallop.  Pulmonary:     Effort: Pulmonary effort is normal.     Breath sounds: Normal breath sounds. No wheezing, rhonchi or rales.  Musculoskeletal:     Cervical back: Normal range of motion and neck supple.  Lymphadenopathy:     Cervical: No cervical adenopathy.  Skin:    General: Skin is warm and dry.     Capillary Refill: Capillary refill takes less than 2 seconds.     Findings: No erythema or rash.  Neurological:     General: No focal deficit present.     Mental Status: He is alert and oriented to person, place, and time.  Psychiatric:        Mood and Affect: Mood normal.        Behavior: Behavior normal.        Thought Content: Thought content normal.        Judgment: Judgment normal.     UC Treatments / Results  Labs (all  labs ordered are listed, but only abnormal results are displayed) Labs  Reviewed  GROUP A STREP BY PCR  SARS CORONAVIRUS 2 (TAT 6-24 HRS)    EKG   Radiology No results found.  Procedures Procedures (including critical care time)  Medications Ordered in UC Medications - No data to display  Initial Impression / Assessment and Plan / UC Course  I have reviewed the triage vital signs and the nursing notes.  Pertinent labs & imaging results that were available during my care of the patient were reviewed by me and considered in my medical decision making (see chart for details).  , Nontoxic-appearing 40 year old male here for evaluation of respiratory complaints and sore throat as outlined in HPI above.  Patient's physical exam reveals mild erythema to the left dependent membrane without injection, bulging, loss of landmarks, or effusion.  The left external auditory canal is clear.  Right tympanic membrane is pearly gray with normal light reflex and clear external auditory canal.  Nasal mucosa is erythematous and edematous with scant clear nasal discharge in both nares.  Oropharyngeal exam reveals mild posterior oropharyngeal erythema with clear postnasal drip.  No tonsillar edema, erythema, or exudate noted.  No cervical lymphadenopathy appreciated on exam.  Cardiopulmonary exam shows clear lung sounds all fields.  COVID and strep collected in triage.  The strep PCR is negative.  Will discharge patient home to isolate pending results of the COVID test.  I have given him Atrovent nasal spray to help with the nasal congestion and Tessalon Perles Promethazine VC cough syrup help with cough and congestion.  Work note provided.   Final Clinical Impressions(s) / UC Diagnoses   Final diagnoses:  Viral URI with cough     Discharge Instructions      Use the Atrovent nasal spray, 2 squirts in each nostril every 6 hours, as needed for runny nose and postnasal drip.  Use the Tessalon Perles  every 8 hours during the day.  Take them with a small sip of water.  They may give you some numbness to the base of your tongue or a metallic taste in your mouth, this is normal.  Use the Promethazine VC cough syrup at bedtime for cough and congestion.  It will make you drowsy so do not take it during the day.  Return for reevaluation or see your primary care provider for any new or worsening symptoms.      ED Prescriptions     Medication Sig Dispense Auth. Provider   benzonatate (TESSALON) 100 MG capsule Take 2 capsules (200 mg total) by mouth every 8 (eight) hours. 21 capsule Becky Augusta, NP   ipratropium (ATROVENT) 0.06 % nasal spray Place 2 sprays into both nostrils 4 (four) times daily. 15 mL Becky Augusta, NP   promethazine-phenylephrine (PROMETHAZINE VC) 6.25-5 MG/5ML SYRP Take 5 mLs by mouth every 6 (six) hours as needed for congestion. 180 mL Becky Augusta, NP      PDMP not reviewed this encounter.   Becky Augusta, NP 11/08/20 [phone]

## 2020-11-08 NOTE — ED Triage Notes (Signed)
Patient c/o sore throat, headache, and nasal congestion that started on Thursday.  Patient denies fevers.

## 2020-11-09 ENCOUNTER — Telehealth: Payer: Self-pay | Admitting: Emergency Medicine

## 2020-11-09 LAB — SARS CORONAVIRUS 2 (TAT 6-24 HRS): SARS Coronavirus 2: POSITIVE — AB

## 2020-11-09 NOTE — Telephone Encounter (Signed)
Patient called asking for an extension of his work note for today.  Note provided.

## 2020-12-13 ENCOUNTER — Encounter: Payer: Self-pay | Admitting: Licensed Clinical Social Worker

## 2020-12-13 ENCOUNTER — Ambulatory Visit
Admission: EM | Admit: 2020-12-13 | Discharge: 2020-12-13 | Disposition: A | Discharge status: Home / Self Care | Payer: BLUE CROSS/BLUE SHIELD | Attending: Family Medicine | Admitting: Family Medicine

## 2020-12-13 ENCOUNTER — Other Ambulatory Visit: Payer: Self-pay

## 2020-12-13 DIAGNOSIS — J40 Bronchitis, not specified as acute or chronic: Secondary | ICD-10-CM

## 2020-12-13 MED ORDER — HYDROCODONE BIT-HOMATROP MBR 5-1.5 MG/5ML PO SOLN: 5.0000 mL | Freq: Four times a day (QID) | ORAL | 0 refills | Status: DC | PRN

## 2020-12-13 MED ORDER — PREDNISONE 10 MG (21) PO TBPK: ORAL_TABLET | ORAL | 0 refills | Status: DC

## 2020-12-13 MED ORDER — AZITHROMYCIN 250 MG PO TABS: ORAL_TABLET | ORAL | 0 refills | Status: DC

## 2020-12-13 NOTE — Discharge Instructions (Signed)
Medications as prescribed. ° °Take care ° °Dr. Markise Haymer  °

## 2020-12-13 NOTE — ED Triage Notes (Signed)
Pt c/o cough, still not feeling better since visit on 11/08/2020.

## 2020-12-13 NOTE — ED Provider Notes (Signed)
[facility]    CSN: [id number] Arrival date & time: 12/13/20  1432      History   Chief Complaint Chief Complaint  Patient presents with   Cough   HPI  40 year old male presents with cough.  Patient has had a cough for over a month.  Cough is dry.  No shortness of breath.  No fever.  Has had no relief with prior treatment.  No other associated symptoms.  No other complaints.  Home Medications    Prior to Admission medications   Medication Sig Start Date End Date Taking? Authorizing Provider  azithromycin (ZITHROMAX) 250 MG tablet 2 tablets on day 1, then 1 tablet daily on days 2-5. 12/13/20  Yes Kweli Grassel G, DO  HYDROcodone bit-homatropine (HYCODAN) 5-1.5 MG/5ML syrup Take 5 mLs by mouth every 6 (six) hours as needed for cough. 12/13/20  Yes Glada Wickstrom G, DO  predniSONE (STERAPRED UNI-PAK 21 TAB) 10 MG (21) TBPK tablet 6 tablets on day 1; decrease by 1 tablet daily until gone. 12/13/20  Yes Zuriyah Shatz G, DO  acetaminophen (TYLENOL) 500 MG tablet Take 2 tablets (1,000 mg total) by mouth every 8 (eight) hours as needed. 09/10/19   Darr, Gerilyn Pilgrim, PA-C  dicyclomine (BENTYL) 20 MG tablet Take 1 tablet (20 mg total) by mouth 4 (four) times daily -  before meals and at bedtime for 5 days. 08/16/19 08/21/19  Shaune Pollack, MD    Family History Family History  Problem Relation Age of Onset   Healthy Mother    Healthy Father     Social History Social History   Tobacco Use   Smoking status: Never   Smokeless tobacco: Never  Vaping Use   Vaping Use: Never used  Substance Use Topics   Alcohol use: No   Drug use: No     Allergies   Patient has no known allergies.   Review of Systems Review of Systems  Constitutional:  Negative for fever.  Respiratory:  Positive for cough.    Physical Exam Triage Vital Signs ED Triage Vitals  Enc Vitals Group     BP 12/13/20 1510 (!) 144/91     Pulse Rate 12/13/20 1510 67     Resp 12/13/20 1510 16     Temp 12/13/20  1510 98.1 F (36.7 C)     Temp Source 12/13/20 1510 Oral     SpO2 12/13/20 1510 100 %     Weight 12/13/20 1509 184 lb 15.5 oz (83.9 kg)     Height 12/13/20 1509 5\' 7"  (1.702 m)     Head Circumference --      Peak Flow --      Pain Score 12/13/20 1509 0     Pain Loc --      Pain Edu? --      Excl. in [place]? --    No data found.  Updated Vital Signs BP (!) 144/91 (BP Location: Left Arm)   Pulse 67   Temp 98.1 F (36.7 C) (Oral)   Resp 16   Ht 5\' 7"  (1.702 m)   Wt 83.9 kg   SpO2 100%   BMI 28.97 kg/m   Visual Acuity Right Eye Distance:   Left Eye Distance:   Bilateral Distance:    Right Eye Near:   Left Eye Near:    Bilateral Near:     Physical Exam Vitals and nursing note reviewed.  Constitutional:      General: He is not in acute distress.  Appearance: Normal appearance. He is not ill-appearing.  HENT:     Head: Normocephalic and atraumatic.  Cardiovascular:     Rate and Rhythm: Normal rate and regular rhythm.  Pulmonary:     Effort: Pulmonary effort is normal.     Breath sounds: Normal breath sounds. No wheezing, rhonchi or rales.  Neurological:     Mental Status: He is alert.  Psychiatric:        Mood and Affect: Mood normal.        Behavior: Behavior normal.     UC Treatments / Results  Labs (all labs ordered are listed, but only abnormal results are displayed) Labs Reviewed - No data to display  EKG   Radiology No results found.  Procedures Procedures (including critical care time)  Medications Ordered in UC Medications - No data to display  Initial Impression / Assessment and Plan / UC Course  I have reviewed the triage vital signs and the nursing notes.  Pertinent labs & imaging results that were available during my care of the patient were reviewed by me and considered in my medical decision making (see chart for details).    40 year old male presents with chronic cough.  Given duration of illness and lack of improvement treating for  bronchitis with azithromycin, prednisone.  Hycodan for cough.  Final Clinical Impressions(s) / UC Diagnoses   Final diagnoses:  Bronchitis     Discharge Instructions      Medications as prescribed.  Take care  Dr. Adriana Simas    ED Prescriptions     Medication Sig Dispense Auth. Provider   azithromycin (ZITHROMAX) 250 MG tablet 2 tablets on day 1, then 1 tablet daily on days 2-5. 6 tablet Carmelita Amparo G, DO   predniSONE (STERAPRED UNI-PAK 21 TAB) 10 MG (21) TBPK tablet 6 tablets on day 1; decrease by 1 tablet daily until gone. 21 tablet Leibish Mcgregor G, DO   HYDROcodone bit-homatropine (HYCODAN) 5-1.5 MG/5ML syrup Take 5 mLs by mouth every 6 (six) hours as needed for cough. 120 mL Tommie Sams, DO      PDMP not reviewed this encounter.   Tommie Sams, [place] 12/13/20 [phone]

## 2021-01-10 ENCOUNTER — Ambulatory Visit
Admission: EM | Admit: 2021-01-10 | Discharge: 2021-01-10 | Disposition: A | Discharge status: Home / Self Care | Payer: 59 | Attending: Physician Assistant | Admitting: Physician Assistant

## 2021-01-10 ENCOUNTER — Other Ambulatory Visit: Payer: Self-pay

## 2021-01-10 ENCOUNTER — Encounter: Payer: Self-pay | Admitting: Emergency Medicine

## 2021-01-10 DIAGNOSIS — L309 Dermatitis, unspecified: Secondary | ICD-10-CM

## 2021-01-10 MED ORDER — TRIAMCINOLONE ACETONIDE 0.1 % EX CREA: 1.0000 "application " | TOPICAL_CREAM | Freq: Two times a day (BID) | CUTANEOUS | 0 refills | Status: DC

## 2021-01-10 NOTE — ED Triage Notes (Signed)
Pt c/o rash on the left side of his neck. He states he has this intermittently for years but this flare started yesterday. He states the rash is itchy. He has tried hydrocortisone cream without relief.

## 2021-01-10 NOTE — ED Provider Notes (Signed)
[facility]    CSN: [id number] Arrival date & time: 01/10/21  1151      History   Chief Complaint Chief Complaint  Patient presents with   Rash    HPI Jeremy Koch is a 41 y.o. male presenting for rash of the left side of his neck for the past couple of days.  Patient reports it itches.  Denies any associated pain or burning.  Patient reports similar symptoms in the past that he believes is due to eczema.  Has not recently tried any treatments.  Has tried hydrocortisone cream in the past which has helped before.  No fevers.  Otherwise feeling well.  No other complaints.  HPI  History reviewed. No pertinent past medical history.  There are no problems to display for this patient.   Past Surgical History:  Procedure Laterality Date   NO PAST SURGERIES         Home Medications    Prior to Admission medications   Medication Sig Start Date End Date Taking? Authorizing Provider  triamcinolone cream (KENALOG) 0.1 % Apply 1 application topically 2 (two) times daily. 01/10/21  Yes Danton Clap, PA-C    Family History Family History  Problem Relation Age of Onset   Healthy Mother    Healthy Father     Social History Social History   Tobacco Use   Smoking status: Never   Smokeless tobacco: Never  Vaping Use   Vaping Use: Never used  Substance Use Topics   Alcohol use: No   Drug use: No     Allergies   Patient has no known allergies.   Review of Systems Review of Systems  Constitutional:  Negative for fatigue and fever.  Musculoskeletal:  Negative for arthralgias, joint swelling and myalgias.  Skin:  Positive for rash.  Neurological:  Negative for weakness.  Hematological:  Negative for adenopathy.    Physical Exam Triage Vital Signs ED Triage Vitals  Enc Vitals Group     BP 01/10/21 1239 (!) 158/96     Pulse Rate 01/10/21 1239 81     Resp 01/10/21 1239 18     Temp 01/10/21 1239 98.7 F (37.1 C)     Temp Source 01/10/21 1239  Oral     SpO2 01/10/21 1239 97 %     Weight 01/10/21 1237 184 lb 15.5 oz (83.9 kg)     Height 01/10/21 1237 5\' 7"  (1.702 m)     Head Circumference --      Peak Flow --      Pain Score 01/10/21 1237 0     Pain Loc --      Pain Edu? --      Excl. in [place]? --    No data found.  Updated Vital Signs BP (!) 158/96 (BP Location: Left Arm)    Pulse 81    Temp 98.7 F (37.1 C) (Oral)    Resp 18    Ht 5\' 7"  (1.702 m)    Wt 184 lb 15.5 oz (83.9 kg)    SpO2 97%    BMI 28.97 kg/m      Physical Exam Vitals and nursing note reviewed.  Constitutional:      General: He is not in acute distress.    Appearance: Normal appearance. He is well-developed. He is not ill-appearing.  HENT:     Head: Normocephalic and atraumatic.  Eyes:     Conjunctiva/sclera: Conjunctivae normal.  Cardiovascular:     Rate and Rhythm:  Normal rate and regular rhythm.  Pulmonary:     Effort: Pulmonary effort is normal. No respiratory distress.     Breath sounds: Normal breath sounds.  Musculoskeletal:     Cervical back: Neck supple.  Skin:    General: Skin is warm and dry.     Capillary Refill: Capillary refill takes less than 2 seconds.     Findings: Rash (slightly erythematous patchy raised rash anterior and left side of neck) present.  Neurological:     General: No focal deficit present.     Mental Status: He is alert. Mental status is at baseline.     Motor: No weakness.     Coordination: Coordination normal.     Gait: Gait normal.  Psychiatric:        Mood and Affect: Mood normal.        Behavior: Behavior normal.        Thought Content: Thought content normal.     UC Treatments / Results  Labs (all labs ordered are listed, but only abnormal results are displayed) Labs Reviewed - No data to display  EKG   Radiology No results found.  Procedures Procedures (including critical care time)  Medications Ordered in UC Medications - No data to display  Initial Impression / Assessment and Plan / UC  Course  I have reviewed the triage vital signs and the nursing notes.  Pertinent labs & imaging results that were available during my care of the patient were reviewed by me and considered in my medical decision making (see chart for details).  41 year old male presenting with eczematous rash over the past day.  Reports itching.  Denies pain.  Clinical presentation is consistent with eczema.  Treating with triamcinolone cream.  Advised to avoid triggers.  Reviewed returning or following up with PCP if not improving or if worsening symptoms in the next 7 to 10 days.   Final Clinical Impressions(s) / UC Diagnoses   Final diagnoses:  Eczema, unspecified type   Discharge Instructions   None    ED Prescriptions     Medication Sig Dispense Auth. Provider   triamcinolone cream (KENALOG) 0.1 % Apply 1 application topically 2 (two) times daily. 30 g Gretta Cool      PDMP not reviewed this encounter.   Danton Clap, PA-C 01/10/21 1350

## 2021-03-02 ENCOUNTER — Other Ambulatory Visit: Payer: Self-pay

## 2021-03-02 ENCOUNTER — Ambulatory Visit
Admission: EM | Admit: 2021-03-02 | Discharge: 2021-03-02 | Disposition: A | Discharge status: Home / Self Care | Payer: 59 | Attending: Physician Assistant | Admitting: Physician Assistant

## 2021-03-02 ENCOUNTER — Encounter: Payer: Self-pay | Admitting: Emergency Medicine

## 2021-03-02 DIAGNOSIS — R110 Nausea: Secondary | ICD-10-CM | POA: Insufficient documentation

## 2021-03-02 DIAGNOSIS — J069 Acute upper respiratory infection, unspecified: Secondary | ICD-10-CM | POA: Diagnosis not present

## 2021-03-02 DIAGNOSIS — Z20822 Contact with and (suspected) exposure to covid-19: Secondary | ICD-10-CM | POA: Insufficient documentation

## 2021-03-02 DIAGNOSIS — R519 Headache, unspecified: Secondary | ICD-10-CM | POA: Diagnosis present

## 2021-03-02 DIAGNOSIS — R0981 Nasal congestion: Secondary | ICD-10-CM | POA: Insufficient documentation

## 2021-03-02 LAB — GROUP A STREP BY PCR: Group A Strep by PCR: NOT DETECTED

## 2021-03-02 NOTE — ED Triage Notes (Signed)
Pt c/o nasal congestion, headache, sore throat, and nausea. Started about 3 days ago. Denies fever. He states his wife had covid about 5 days ago. He states he has done 3 home covid test and were negative.

## 2021-03-02 NOTE — Discharge Instructions (Signed)
Your symptoms today are most likely being caused by a virus and should steadily improve in time it can take up to 7 to 10 days before you truly start to see a turnaround however things will get better  Strep test is negative, COVID test is pending, you will be notified if positive    You can take Tylenol and/or Ibuprofen as needed for fever reduction and pain relief.   For cough: honey 1/2 to 1 teaspoon (you can dilute the honey in water or another fluid).  You can also use guaifenesin and dextromethorphan for cough. You can use a humidifier for chest congestion and cough.  If you don't have a humidifier, you can sit in the bathroom with the hot shower running.      For sore throat: try warm salt water gargles, cepacol lozenges, throat spray, warm tea or water with lemon/honey, popsicles or ice, or OTC cold relief medicine for throat discomfort.   For congestion: take a daily anti-histamine like Zyrtec, Claritin, and a oral decongestant, such as pseudoephedrine.  You can also use Flonase 1-2 sprays in each nostril daily.   It is important to stay hydrated: drink plenty of fluids (water, gatorade/powerade/pedialyte, juices, or teas) to keep your throat moisturized and help further relieve irritation/discomfort.

## 2021-03-02 NOTE — ED Provider Notes (Signed)
[facility]    CSN: [id number] Arrival date & time: 03/02/21  1545      History   Chief Complaint Chief Complaint  Patient presents with   Sore Throat   Headache    HPI Jeremy Koch is a 41 y.o. male.   Patient presents with nasal congestion, rhinorrhea, itchy throat, nausea without vomiting , body aches and generalized headaches for 3 days.  COVID exposure in household, wife tested 5 days ago.  Tolerating food and liquids.  Has attempted use of allergy medicine which has not been effective.  No pertinent medical history.   History reviewed. No pertinent past medical history.  There are no problems to display for this patient.   Past Surgical History:  Procedure Laterality Date   NO PAST SURGERIES         Home Medications    Prior to Admission medications   Medication Sig Start Date End Date Taking? Authorizing Provider  triamcinolone cream (KENALOG) 0.1 % Apply 1 application topically 2 (two) times daily. 01/10/21   Shirlee Latch, PA-C    Family History Family History  Problem Relation Age of Onset   Healthy Mother    Healthy Father     Social History Social History   Tobacco Use   Smoking status: Never   Smokeless tobacco: Never  Vaping Use   Vaping Use: Never used  Substance Use Topics   Alcohol use: No   Drug use: No     Allergies   Patient has no known allergies.   Review of Systems Review of Systems  Constitutional: Negative.   HENT:  Positive for congestion and rhinorrhea. Negative for dental problem, drooling, ear discharge, ear pain, facial swelling, hearing loss, mouth sores, nosebleeds, postnasal drip, sinus pressure, sinus pain, sneezing, sore throat, tinnitus, trouble swallowing and voice change.   Respiratory: Negative.    Cardiovascular: Negative.   Gastrointestinal:  Positive for nausea. Negative for abdominal distention, abdominal pain, anal bleeding, blood in stool, constipation, diarrhea, rectal pain and  vomiting.  Musculoskeletal:  Positive for myalgias. Negative for arthralgias, back pain, gait problem, joint swelling, neck pain and neck stiffness.  Skin: Negative.   Neurological:  Positive for headaches. Negative for dizziness, tremors, seizures, syncope, facial asymmetry, speech difficulty, weakness, light-headedness and numbness.    Physical Exam Triage Vital Signs ED Triage Vitals  Enc Vitals Group     BP 03/02/21 1651 (!) 157/96     Pulse Rate 03/02/21 1651 71     Resp 03/02/21 1651 18     Temp 03/02/21 1651 98.2 F (36.8 C)     Temp Source 03/02/21 1651 Oral     SpO2 03/02/21 1651 99 %     Weight 03/02/21 1649 184 lb 15.5 oz (83.9 kg)     Height 03/02/21 1649 5\' 7"  (1.702 m)     Head Circumference --      Peak Flow --      Pain Score 03/02/21 1648 8     Pain Loc --      Pain Edu? --      Excl. in [place]? --    No data found.  Updated Vital Signs BP (!) 157/96 (BP Location: Right Arm)    Pulse 71    Temp 98.2 F (36.8 C) (Oral)    Resp 18    Ht 5\' 7"  (1.702 m)    Wt 184 lb 15.5 oz (83.9 kg)    SpO2 99%    BMI 28.97  kg/m   Visual Acuity Right Eye Distance:   Left Eye Distance:   Bilateral Distance:    Right Eye Near:   Left Eye Near:    Bilateral Near:     Physical Exam Constitutional:      Appearance: Normal appearance.  HENT:     Head: Normocephalic.     Right Ear: Tympanic membrane, ear canal and external ear normal.     Left Ear: Tympanic membrane and external ear normal.     Nose: Congestion and rhinorrhea present.     Mouth/Throat:     Mouth: Mucous membranes are moist.     Pharynx: Oropharynx is clear.  Eyes:     Extraocular Movements: Extraocular movements intact.  Cardiovascular:     Rate and Rhythm: Normal rate and regular rhythm.     Pulses: Normal pulses.     Heart sounds: Normal heart sounds.  Pulmonary:     Effort: Pulmonary effort is normal.     Breath sounds: Normal breath sounds.  Musculoskeletal:        General: Normal range of  motion.     Cervical back: Normal range of motion and neck supple.  Skin:    General: Skin is warm and dry.  Neurological:     Mental Status: He is alert and oriented to person, place, and time. Mental status is at baseline.  Psychiatric:        Mood and Affect: Mood normal.        Behavior: Behavior normal.     UC Treatments / Results  Labs (all labs ordered are listed, but only abnormal results are displayed) Labs Reviewed  SARS CORONAVIRUS 2 (TAT 6-24 HRS)  GROUP A STREP BY PCR    EKG   Radiology No results found.  Procedures Procedures (including critical care time)  Medications Ordered in UC Medications - No data to display  Initial Impression / Assessment and Plan / UC Course  I have reviewed the triage vital signs and the nursing notes.  Pertinent labs & imaging results that were available during my care of the patient were reviewed by me and considered in my medical decision making (see chart for details).  Viral upper respiratory infection  Strep test negative, COVID test pending, will notify of positive results, patient may use over-the-counter medications for supportive care, work note given, urgent care follow-up as needed Final Clinical Impressions(s) / UC Diagnoses   Final diagnoses:  None   Discharge Instructions   None    ED Prescriptions   None    PDMP not reviewed this encounter.   Valinda Hoar, NP 03/02/21 1753

## 2021-03-03 LAB — SARS CORONAVIRUS 2 (TAT 6-24 HRS): SARS Coronavirus 2: NEGATIVE

## 2021-07-31 ENCOUNTER — Ambulatory Visit
Admission: EM | Admit: 2021-07-31 | Discharge: 2021-07-31 | Disposition: A | Discharge status: Home / Self Care | Payer: 59 | Attending: Emergency Medicine | Admitting: Emergency Medicine

## 2021-07-31 DIAGNOSIS — H66002 Acute suppurative otitis media without spontaneous rupture of ear drum, left ear: Secondary | ICD-10-CM

## 2021-07-31 DIAGNOSIS — H6123 Impacted cerumen, bilateral: Secondary | ICD-10-CM

## 2021-07-31 LAB — GROUP A STREP BY PCR: Group A Strep by PCR: NOT DETECTED

## 2021-07-31 MED ORDER — AMOXICILLIN-POT CLAVULANATE 875-125 MG PO TABS: 1.0000 | ORAL_TABLET | Freq: Two times a day (BID) | ORAL | 0 refills | Status: AC

## 2021-07-31 NOTE — Discharge Instructions (Addendum)
Take the Augmentin twice daily for 10 days with food for treatment of your ear infection.  Take an over-the-counter probiotic 1 hour after each dose of antibiotic to prevent diarrhea.  Use over-the-counter Tylenol and ibuprofen as needed for pain or fever.  Place a hot water bottle, or heating pad, underneath your pillowcase at night to help dilate up your ear and aid in pain relief as well as resolution of the infection.  Use salt water gargles, 1 tablespoon of table salt in 8 ounces of warm water, gargle and spit, 2-3 times a day to help soothe your throat.  Return for reevaluation for any new or worsening symptoms.

## 2021-07-31 NOTE — ED Provider Notes (Signed)
[facility]    CSN: [id number] Arrival date & time: 07/31/21  1757      History   Chief Complaint Chief Complaint  Patient presents with   Sore Throat   Otalgia    Left ear    HPI Jeremy Koch is a 41 y.o. male.   HPI  41 year old male here for evaluation of upper respiratory symptoms.  Patient reports that for last week he has been experiencing left ear pain and a sore throat.  He also endorses some congestion and intermittent runny nose.  He denies any fever, ringing in his ears, change in hearing, cough, shortness breath, or wheezing.  History reviewed. No pertinent past medical history.  There are no problems to display for this patient.   Past Surgical History:  Procedure Laterality Date   NO PAST SURGERIES         Home Medications    Prior to Admission medications   Medication Sig Start Date End Date Taking? Authorizing Provider  amoxicillin-clavulanate (AUGMENTIN) 875-125 MG tablet Take 1 tablet by mouth every 12 (twelve) hours for 10 days. 07/31/21 08/10/21 Yes Becky Augusta, NP  triamcinolone cream (KENALOG) 0.1 % Apply 1 application topically 2 (two) times daily. 01/10/21   Shirlee Latch, PA-C    Family History Family History  Problem Relation Age of Onset   Healthy Mother    Healthy Father     Social History Social History   Tobacco Use   Smoking status: Never   Smokeless tobacco: Never  Vaping Use   Vaping Use: Never used  Substance Use Topics   Alcohol use: No   Drug use: No     Allergies   Patient has no known allergies.   Review of Systems Review of Systems  Constitutional:  Negative for fever.  HENT:  Positive for congestion, ear pain and rhinorrhea. Negative for ear discharge, hearing loss and tinnitus.   Respiratory:  Negative for cough, shortness of breath and wheezing.      Physical Exam Triage Vital Signs ED Triage Vitals  Enc Vitals Group     BP 07/31/21 1812 (!) 156/86     Pulse Rate 07/31/21  1812 72     Resp --      Temp 07/31/21 1812 98.6 F (37 C)     Temp src --      SpO2 07/31/21 1812 100 %     Weight 07/31/21 1810 185 lb (83.9 kg)     Height 07/31/21 1810 5\' 7"  (1.702 m)     Head Circumference --      Peak Flow --      Pain Score 07/31/21 1810 8     Pain Loc --      Pain Edu? --      Excl. in [place]? --    No data found.  Updated Vital Signs BP (!) 156/86 (BP Location: Left Arm)   Pulse 72   Temp 98.6 F (37 C)   Ht 5\' 7"  (1.702 m)   Wt 185 lb (83.9 kg)   SpO2 100%   BMI 28.98 kg/m   Visual Acuity Right Eye Distance:   Left Eye Distance:   Bilateral Distance:    Right Eye Near:   Left Eye Near:    Bilateral Near:     Physical Exam Vitals and nursing note reviewed.  Constitutional:      Appearance: Normal appearance. He is not ill-appearing.  HENT:     Head: Normocephalic and atraumatic.  Right Ear: There is impacted cerumen.     Left Ear: There is impacted cerumen.     Nose: Nose normal. No congestion or rhinorrhea.     Mouth/Throat:     Mouth: Mucous membranes are moist.     Pharynx: Oropharynx is clear. Posterior oropharyngeal erythema present. No oropharyngeal exudate.  Cardiovascular:     Rate and Rhythm: Normal rate and regular rhythm.     Pulses: Normal pulses.     Heart sounds: Normal heart sounds. No murmur heard.    No friction rub. No gallop.  Pulmonary:     Effort: Pulmonary effort is normal.     Breath sounds: Normal breath sounds. No wheezing, rhonchi or rales.  Musculoskeletal:     Cervical back: Normal range of motion and neck supple.  Lymphadenopathy:     Cervical: No cervical adenopathy.  Skin:    General: Skin is warm and dry.     Capillary Refill: Capillary refill takes less than 2 seconds.     Findings: No erythema or rash.  Neurological:     General: No focal deficit present.     Mental Status: He is alert and oriented to person, place, and time.  Psychiatric:        Mood and Affect: Mood normal.         Behavior: Behavior normal.        Thought Content: Thought content normal.        Judgment: Judgment normal.      UC Treatments / Results  Labs (all labs ordered are listed, but only abnormal results are displayed) Labs Reviewed  GROUP A STREP BY PCR    EKG   Radiology No results found.  Procedures Procedures (including critical care time)  Medications Ordered in UC Medications - No data to display  Initial Impression / Assessment and Plan / UC Course  I have reviewed the triage vital signs and the nursing notes.  Pertinent labs & imaging results that were available during my care of the patient were reviewed by me and considered in my medical decision making (see chart for details).  Patient is a pleasant, nontoxic-appearing 41 year old male here for evaluation of 1 weeks worth of left ear pain and sore throat as outlined HPI above.  He also endorses some nasal congestion and intermittent runny nose.  On exam both the patient's external auditory canals are occluded by dark, moist cerumen.  Nasal mucosa is pink and moist without erythema, edema, or discharge.  Oropharyngeal exam reveals mild erythema to the soft palate I am unable to visualize the full posterior oropharynx due to the patient's tongue being in the way.  No cervical lymphadenopathy appreciated on exam.  Cardiopulmonary exam reveals S1-S2 heart sounds with regular rate and rhythm and lung sounds that are clear to auscultation all fields.  Strep PCR was collected at triage.  I will order a lavage of both ears to remove cerumen and reevaluate.  Strep PCR is negative.  Reevaluation of both ears reveals an erythematous and injected left tympanic membrane.  The right tympanic membrane is pearly gray in appearance with normal light reflex.  Both external auditory canals are clear.  I will discharge patient home with a diagnosis of otitis media in the left ear and treated with Augmentin twice daily for 10 days.  He can use  over-the-counter Tylenol and ibuprofen as needed for pain.  He can also use salt water gargles to help soothe his throat.   Final Clinical  Impressions(s) / UC Diagnoses   Final diagnoses:  Non-recurrent acute suppurative otitis media of left ear without spontaneous rupture of tympanic membrane     Discharge Instructions      Take the Augmentin twice daily for 10 days with food for treatment of your ear infection.  Take an over-the-counter probiotic 1 hour after each dose of antibiotic to prevent diarrhea.  Use over-the-counter Tylenol and ibuprofen as needed for pain or fever.  Place a hot water bottle, or heating pad, underneath your pillowcase at night to help dilate up your ear and aid in pain relief as well as resolution of the infection.  Use salt water gargles, 1 tablespoon of table salt in 8 ounces of warm water, gargle and spit, 2-3 times a day to help soothe your throat.  Return for reevaluation for any new or worsening symptoms.      ED Prescriptions     Medication Sig Dispense Auth. Provider   amoxicillin-clavulanate (AUGMENTIN) 875-125 MG tablet Take 1 tablet by mouth every 12 (twelve) hours for 10 days. 20 tablet Becky Augusta, NP      PDMP not reviewed this encounter.   Becky Augusta, NP 07/31/21 [phone]

## 2021-07-31 NOTE — ED Triage Notes (Signed)
Patient presents to UC for sore throat and left ear pain -- patient reports pain for about a week now.

## 2022-07-10 ENCOUNTER — Ambulatory Visit
Admission: EM | Admit: 2022-07-10 | Discharge: 2022-07-10 | Disposition: A | Discharge status: Home / Self Care | Payer: 59

## 2022-07-10 DIAGNOSIS — R519 Headache, unspecified: Secondary | ICD-10-CM

## 2022-07-10 DIAGNOSIS — H9202 Otalgia, left ear: Secondary | ICD-10-CM | POA: Diagnosis not present

## 2022-07-10 DIAGNOSIS — R030 Elevated blood-pressure reading, without diagnosis of hypertension: Secondary | ICD-10-CM | POA: Diagnosis not present

## 2022-07-10 NOTE — ED Triage Notes (Signed)
Pt reports starting yesterday with frontal headache and left sided otalgia. Reports "a lot of sneezing" today.

## 2022-07-10 NOTE — Discharge Instructions (Addendum)
-  You do not have an ear infection.  You have a little bit of earwax.  You may use over-the-counter Debrox eardrops. - Take ibuprofen for your headache as needed since it was helpful before. - Keep a log of your blood pressure.  It was elevated in clinic today.  If you have consistently elevated blood pressures greater than 140/90 you should follow-up with your primary care provider as he may need to be on medication. - If your headache worsens or you start to feel really dizzy, like you are going to pass out, have vomiting, vision changes, chest pain, palpitations, shortness of breath please go to the ER. - As we discussed since you are noticing some postnasal drainage you may be starting to become a little ill.  Use over-the-counter nasal spray such as Flonase or any congestion.

## 2022-07-10 NOTE — ED Provider Notes (Signed)
[facility]    CSN: [id number] Arrival date & time: 07/10/22  0803      History   Chief Complaint Chief Complaint  Patient presents with   Otalgia   Headache    HPI Jeremy Koch is a 42 y.o. male presenting for frontal headache and left-sided ear pain since yesterday.  Reports sneezing a lot and having postnasal drainage as well.  Headache is currently 4 out of 10.  It has improved from yesterday.  It does go away when he takes Motrin but he has not taken any Motrin today.  He denies dizziness, blurred vision, sinus pain, cough, sore throat.  No drainage from ear or hearing issue.  No pain when touching the ear.  No recent swimming.  Denies any sick contacts or known exposure to COVID.  No history of headaches.  Patient denies any medical problems.  No other complaints.  HPI  History reviewed. No pertinent past medical history.  There are no problems to display for this patient.   Past Surgical History:  Procedure Laterality Date   NO PAST SURGERIES         Home Medications    Prior to Admission medications   Not on File    Family History Family History  Problem Relation Age of Onset   Healthy Mother    Healthy Father     Social History Social History   Tobacco Use   Smoking status: Never   Smokeless tobacco: Never  Vaping Use   Vaping Use: Never used  Substance Use Topics   Alcohol use: No   Drug use: No     Allergies   Patient has no known allergies.   Review of Systems Review of Systems  Constitutional:  Negative for fatigue and fever.  HENT:  Positive for ear pain, postnasal drip and sneezing. Negative for congestion, ear discharge, hearing loss, rhinorrhea, sinus pressure, sinus pain and sore throat.   Respiratory:  Negative for cough and shortness of breath.   Cardiovascular:  Negative for chest pain and palpitations.  Gastrointestinal:  Negative for abdominal pain, diarrhea, nausea and vomiting.  Musculoskeletal:   Negative for myalgias.  Neurological:  Positive for headaches. Negative for weakness and light-headedness.  Hematological:  Negative for adenopathy.     Physical Exam Triage Vital Signs ED Triage Vitals  Enc Vitals Group     BP 07/10/22 0812 (!) 163/104     Pulse Rate 07/10/22 0812 82     Resp 07/10/22 0812 18     Temp 07/10/22 0812 98.4 F (36.9 C)     Temp Source 07/10/22 0812 Oral     SpO2 07/10/22 0812 96 %     Weight 07/10/22 0810 190 lb (86.2 kg)     Height 07/10/22 0810 5\' 7"  (1.702 m)     Head Circumference --      Peak Flow --      Pain Score 07/10/22 0810 8     Pain Loc --      Pain Edu? --      Excl. in GC? --    No data found.  Updated Vital Signs BP (!) 163/104 (BP Location: Left Arm)   Pulse 82   Temp 98.4 F (36.9 C) (Oral)   Resp 18   Ht 5\' 7"  (1.702 m)   Wt 190 lb (86.2 kg)   SpO2 96%   BMI 29.76 kg/m      Physical Exam Vitals and nursing note reviewed.  Constitutional:  General: He is not in acute distress.    Appearance: Normal appearance. He is well-developed. He is not ill-appearing.  HENT:     Head: Normocephalic and atraumatic.     Right Ear: Tympanic membrane, ear canal and external ear normal.     Left Ear: Tympanic membrane, ear canal and external ear normal.     Nose: Congestion present.     Mouth/Throat:     Mouth: Mucous membranes are moist.     Pharynx: Oropharynx is clear.  Eyes:     General: No scleral icterus.    Extraocular Movements: Extraocular movements intact.     Conjunctiva/sclera: Conjunctivae normal.     Pupils: Pupils are equal, round, and reactive to light.  Cardiovascular:     Rate and Rhythm: Normal rate and regular rhythm.     Heart sounds: Normal heart sounds.  Pulmonary:     Effort: Pulmonary effort is normal. No respiratory distress.     Breath sounds: Normal breath sounds.  Musculoskeletal:     Cervical back: Neck supple.  Skin:    General: Skin is warm and dry.     Capillary Refill: Capillary  refill takes less than 2 seconds.  Neurological:     General: No focal deficit present.     Mental Status: He is alert and oriented to person, place, and time. Mental status is at baseline.     Cranial Nerves: No cranial nerve deficit (grossly intact).     Motor: No weakness.     Gait: Gait normal.  Psychiatric:        Mood and Affect: Mood normal.        Behavior: Behavior normal.      UC Treatments / Results  Labs (all labs ordered are listed, but only abnormal results are displayed) Labs Reviewed - No data to display  EKG   Radiology No results found.  Procedures Procedures (including critical care time)  Medications Ordered in UC Medications - No data to display  Initial Impression / Assessment and Plan / UC Course  I have reviewed the triage vital signs and the nursing notes.  Pertinent labs & imaging results that were available during my care of the patient were reviewed by me and considered in my medical decision making (see chart for details).   42 year old male presents for headache and left-sided ear pain since yesterday.  Also reports postnasal drainage and sneezing.  No cough, fever, sinus pain, sore throat.  BP 163/104.  He is afebrile and overall well-appearing.  On exam he has slight nasal congestion.  No evidence of an ear infection.  Throat clear.  Chest clear to auscultation heart regular rate and rhythm.  Advised patient he may be starting to become ill.  He reports issues with his sinuses.  Advised use of OTC meds.  Continue ibuprofen and low doses for the headache.  Discussed ED precautions and advised him to follow-up with PCP regarding elevated blood pressures.   Final Clinical Impressions(s) / UC Diagnoses   Final diagnoses:  Acute nonintractable headache, unspecified headache type  Otalgia of left ear  Elevated blood pressure reading     Discharge Instructions      -You do not have an ear infection.  You have a little bit of earwax.  You  may use over-the-counter Debrox eardrops. - Take ibuprofen for your headache as needed since it was helpful before. - Keep a log of your blood pressure.  It was elevated in clinic today.  If you have consistently elevated blood pressures greater than 140/90 you should follow-up with your primary care provider as he may need to be on medication. - If your headache worsens or you start to feel really dizzy, like you are going to pass out, have vomiting, vision changes, chest pain, palpitations, shortness of breath please go to the ER. - As we discussed since you are noticing some postnasal drainage you may be starting to become a little ill.  Use over-the-counter nasal spray such as Flonase or any congestion.     ED Prescriptions   None    PDMP not reviewed this encounter.   Shirlee Latch, PA-C 07/10/22 [phone]

## 2022-09-13 ENCOUNTER — Ambulatory Visit
Admission: EM | Admit: 2022-09-13 | Discharge: 2022-09-13 | Disposition: A | Discharge status: Home / Self Care | Payer: 59 | Attending: Emergency Medicine | Admitting: Emergency Medicine

## 2022-09-13 DIAGNOSIS — Z1152 Encounter for screening for COVID-19: Secondary | ICD-10-CM | POA: Insufficient documentation

## 2022-09-13 DIAGNOSIS — B349 Viral infection, unspecified: Secondary | ICD-10-CM | POA: Insufficient documentation

## 2022-09-13 LAB — SARS CORONAVIRUS 2 BY RT PCR: SARS Coronavirus 2 by RT PCR: NEGATIVE

## 2022-09-13 NOTE — ED Provider Notes (Signed)
[facility]    CSN: [id number] Arrival date & time: 09/13/22  1818      History   Chief Complaint No chief complaint on file.   HPI Jeremy Koch is a 42 y.o. male.   41-year male patient, Jeremy Koch, presents to urgent care for evaluation of sinus congestion and pressure for 3 days.  Patient has been taking Allegra for symptoms, works at [employer] illness exposure, needs work note  The history is provided by the patient. No language interpreter was used.    History reviewed. No pertinent past medical history.  Patient Active Problem List   Diagnosis Date Noted   Nonspecific syndrome suggestive of viral illness 09/13/2022    Past Surgical History:  Procedure Laterality Date   NO PAST SURGERIES         Home Medications    Prior to Admission medications   Not on File    Family History Family History  Problem Relation Age of Onset   Healthy Mother    Healthy Father     Social History Social History   Tobacco Use   Smoking status: Never   Smokeless tobacco: Never  Vaping Use   Vaping status: Never Used  Substance Use Topics   Alcohol use: No   Drug use: No     Allergies   Patient has no known allergies.   Review of Systems Review of Systems  Constitutional:  Negative for fever.  HENT:  Positive for congestion.   All other systems reviewed and are negative.    Physical Exam Triage Vital Signs ED Triage Vitals  Encounter Vitals Group     BP 09/13/22 1847 (!) 175/93     Systolic BP Percentile --      Diastolic BP Percentile --      Pulse Rate 09/13/22 1846 78     Resp --      Temp 09/13/22 1846 98.2 F (36.8 C)     Temp Source 09/13/22 1846 Oral     SpO2 09/13/22 1846 100 %     Weight 09/13/22 1845 190 lb (86.2 kg)     Height 09/13/22 1845 5\' 7"  (1.702 m)     Head Circumference --      Peak Flow --      Pain Score 09/13/22 1845 7     Pain Loc --      Pain Education --      Exclude from Growth  Chart --    No data found.  Updated Vital Signs BP (!) 175/93 (BP Location: Left Arm)   Pulse 78   Temp 98.2 F (36.8 C) (Oral)   Ht 5\' 7"  (1.702 m)   Wt 190 lb (86.2 kg)   SpO2 100%   BMI 29.76 kg/m   Visual Acuity Right Eye Distance:   Left Eye Distance:   Bilateral Distance:    Right Eye Near:   Left Eye Near:    Bilateral Near:     Physical Exam Vitals and nursing note reviewed.  Constitutional:      General: He is not in acute distress.    Appearance: He is well-developed and well-groomed.  HENT:     Head: Normocephalic and atraumatic.     Right Ear: Tympanic membrane is retracted.     Left Ear: Tympanic membrane is retracted.     Nose: Congestion present.     Right Sinus: No maxillary sinus tenderness or frontal sinus tenderness.     Left Sinus: No  maxillary sinus tenderness or frontal sinus tenderness.     Mouth/Throat:     Lips: Pink.     Mouth: Mucous membranes are moist.     Pharynx: Oropharynx is clear.  Eyes:     General: Lids are normal.     Extraocular Movements: Extraocular movements intact.     Conjunctiva/sclera: Conjunctivae normal.     Pupils: Pupils are equal, round, and reactive to light.  Neck:     Trachea: Trachea normal.  Cardiovascular:     Rate and Rhythm: Normal rate and regular rhythm.     Pulses: Normal pulses.     Heart sounds: Normal heart sounds. No murmur heard. Pulmonary:     Effort: Pulmonary effort is normal. No respiratory distress.     Breath sounds: Normal breath sounds and air entry.  Abdominal:     Palpations: Abdomen is soft.     Tenderness: There is no abdominal tenderness.  Musculoskeletal:        General: No swelling.     Cervical back: Normal range of motion and neck supple.  Skin:    General: Skin is warm and dry.     Capillary Refill: Capillary refill takes less than 2 seconds.  Neurological:     General: No focal deficit present.     Mental Status: He is alert and oriented to person, place, and time.      GCS: GCS eye subscore is 4. GCS verbal subscore is 5. GCS motor subscore is 6.  Psychiatric:        Attention and Perception: Attention normal.        Mood and Affect: Mood normal.        Speech: Speech normal.        Behavior: Behavior normal. Behavior is cooperative.      UC Treatments / Results  Labs (all labs ordered are listed, but only abnormal results are displayed) Labs Reviewed  SARS CORONAVIRUS 2 BY RT PCR    EKG   Radiology No results found.  Procedures Procedures (including critical care time)  Medications Ordered in UC Medications - No data to display  Initial Impression / Assessment and Plan / UC Course  I have reviewed the triage vital signs and the nursing notes.  Pertinent labs & imaging results that were available during my care of the patient were reviewed by me and considered in my medical decision making (see chart for details).    Discussed exam findings and plan of care with patient, strict go to ER precautions given.   Patient verbalized understanding to this provider.  Ddx: Viral illness, allergies, URI Final Clinical Impressions(s) / UC Diagnoses   Final diagnoses:  Nonspecific syndrome suggestive of viral illness     Discharge Instructions      Covid test is negative. Most likely you have a viral illness: no antibiotic as indicated at this time, May treat with OTC meds of choice. Make sure to drink plenty of fluids to stay hydrated(gatorade, water, popsicles,jello,etc), avoid caffeine products. Follow up with PCP. Return as needed.     ED Prescriptions   None    PDMP not reviewed this encounter.   Clancy Gourd, NP 09/13/22 2006

## 2022-09-13 NOTE — Discharge Instructions (Signed)
Covid test is negative. Most likely you have a viral illness: no antibiotic as indicated at this time, May treat with OTC meds of choice. Make sure to drink plenty of fluids to stay hydrated(gatorade, water, popsicles,jello,etc), avoid caffeine products. Follow up with PCP. Return as needed.

## 2022-09-13 NOTE — ED Triage Notes (Signed)
Patient presents with sinus pressure, congestion x day 3/4 days ago. Tx with Allegra.

## 2022-09-17 ENCOUNTER — Encounter: Payer: Self-pay | Admitting: Emergency Medicine

## 2022-09-17 ENCOUNTER — Ambulatory Visit
Admission: EM | Admit: 2022-09-17 | Discharge: 2022-09-17 | Disposition: A | Discharge status: Home / Self Care | Payer: 59 | Attending: Emergency Medicine | Admitting: Emergency Medicine

## 2022-09-17 DIAGNOSIS — J069 Acute upper respiratory infection, unspecified: Secondary | ICD-10-CM

## 2022-09-17 DIAGNOSIS — R030 Elevated blood-pressure reading, without diagnosis of hypertension: Secondary | ICD-10-CM

## 2022-09-17 MED ORDER — DOXYCYCLINE HYCLATE 100 MG PO CAPS: 100.0000 mg | ORAL_CAPSULE | Freq: Two times a day (BID) | ORAL | 0 refills | Status: DC

## 2022-09-17 NOTE — Discharge Instructions (Addendum)
Please follow-up with PCP for reevaluation of blood pressure and it was elevated in office, avoid Alka-Seltzer cold medicine as it may be making her blood pressure go up.  Take Coricidin HBP as directed and antibiotic for symptom management, drink plenty of water.

## 2022-09-17 NOTE — ED Triage Notes (Signed)
Patient c/o cough, chest congestion, nasal congestion that started 5 days ago.  Patient was seen here on 09/13/22.  Patient denies fevers.  Patient needs a work note.

## 2022-09-17 NOTE — ED Provider Notes (Signed)
[facility]    CSN: [id number] Arrival date & time: 09/17/22  0815      History   Chief Complaint Chief Complaint  Patient presents with   Cough   Nasal Congestion    HPI Jeremy Koch is a 42 y.o. male.   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  Patient was recently seen here on 9/9 with same symptoms, no improvement.  Patient states he is taking Alka-Seltzer cold medicine for his symptom management without relief.  The history is provided by the patient. No language interpreter was used.    History reviewed. No pertinent past medical history.  Patient Active Problem List   Diagnosis Date Noted   Acute URI 09/17/2022   Elevated blood pressure reading 09/17/2022   Nonspecific syndrome suggestive of viral illness 09/13/2022    Past Surgical History:  Procedure Laterality Date   NO PAST SURGERIES         Home Medications    Prior to Admission medications   Medication Sig Start Date End Date Taking? Authorizing Provider  doxycycline (VIBRAMYCIN) 100 MG capsule Take 1 capsule (100 mg total) by mouth 2 (two) times daily. 09/17/22  Yes Vanessia Bokhari, Para March, NP    Family History Family History  Problem Relation Age of Onset   Healthy Mother    Healthy Father     Social History Social History   Tobacco Use   Smoking status: Never   Smokeless tobacco: Never  Vaping Use   Vaping status: Never Used  Substance Use Topics   Alcohol use: No   Drug use: No     Allergies   Patient has no known allergies.   Review of Systems Review of Systems  Constitutional:  Negative for fever.  HENT:  Positive for congestion and sinus pressure.   Respiratory:  Negative for cough.   All other systems reviewed and are negative.    Physical Exam Triage Vital Signs ED Triage Vitals  Encounter Vitals Group     BP 09/17/22 0822 (!) 164/102     Systolic BP Percentile --       Diastolic BP Percentile --      Pulse Rate 09/17/22 0822 89     Resp 09/17/22 0822 15     Temp 09/17/22 0822 98.5 F (36.9 C)     Temp Source 09/17/22 0822 Oral     SpO2 09/17/22 0822 97 %     Weight 09/17/22 0821 190 lb 0.6 oz (86.2 kg)     Height 09/17/22 0821 5\' 7"  (1.702 m)     Head Circumference --      Peak Flow --      Pain Score 09/17/22 0821 7     Pain Loc --      Pain Education --      Exclude from Growth Chart --    No data found.  Updated Vital Signs BP (!) 164/102 (BP Location: Left Arm)   Pulse 89   Temp 98.5 F (36.9 C) (Oral)   Resp 15   Ht 5\' 7"  (1.702 m)   Wt 190 lb 0.6 oz (86.2 kg)   SpO2 97%   BMI 29.76 kg/m   Visual Acuity Right Eye Distance:   Left Eye Distance:   Bilateral Distance:    Right Eye Near:   Left Eye Near:    Bilateral Near:     Physical Exam Vitals and  nursing note reviewed.  Constitutional:      General: He is active. He is not in acute distress.    Appearance: He is well-developed. He is not ill-appearing or toxic-appearing.  HENT:     Head: Normocephalic.     Right Ear: Tympanic membrane is retracted.     Left Ear: Tympanic membrane is retracted.     Nose: Mucosal edema present.     Right Sinus: Maxillary sinus tenderness present.     Left Sinus: Maxillary sinus tenderness present.     Mouth/Throat:     Lips: Pink.     Mouth: Oropharynx is clear and moist and mucous membranes are normal. Mucous membranes are moist.     Pharynx: Uvula midline. Postnasal drip present.  Eyes:     General: Lids are normal.     Extraocular Movements: EOM normal.     Conjunctiva/sclera: Conjunctivae normal.     Pupils: Pupils are equal, round, and reactive to light.  Cardiovascular:     Rate and Rhythm: Normal rate and regular rhythm.     Pulses: Normal pulses.     Heart sounds: Normal heart sounds.  Pulmonary:     Effort: Pulmonary effort is normal. No respiratory distress.     Breath sounds: Normal breath sounds and air entry.  No decreased breath sounds or wheezing.  Abdominal:     General: There is no distension.     Palpations: Abdomen is soft.  Musculoskeletal:        General: Normal range of motion.     Cervical back: Normal range of motion.  Skin:    General: Skin is warm, dry and intact.     Findings: No rash.  Neurological:     General: No focal deficit present.     Mental Status: He is alert and oriented to person, place, and time.     GCS: GCS eye subscore is 4. GCS verbal subscore is 5. GCS motor subscore is 6.     Cranial Nerves: No cranial nerve deficit.     Sensory: No sensory deficit.  Psychiatric:        Attention and Perception: Attention normal.        Mood and Affect: Mood and affect and mood normal.        Speech: Speech normal.        Behavior: Behavior normal. Behavior is cooperative.      UC Treatments / Results  Labs (all labs ordered are listed, but only abnormal results are displayed) Labs Reviewed - No data to display  EKG   Radiology No results found.  Procedures Procedures (including critical care time)  Medications Ordered in UC Medications - No data to display  Initial Impression / Assessment and Plan / UC Course  I have reviewed the triage vital signs and the nursing notes.  Pertinent labs & imaging results that were available during my care of the patient were reviewed by me and considered in my medical decision making (see chart for details).    Discussed exam findings and plan of care with patient, strict go to ER precautions given.   Patient verbalized understanding to this provider.  Ddx: Acute URI, allergies Final Clinical Impressions(s) / UC Diagnoses   Final diagnoses:  Acute URI  Elevated blood pressure reading     Discharge Instructions      Please follow-up with PCP for reevaluation of blood pressure and it was elevated in office, avoid Alka-Seltzer cold medicine as it may be  making her blood pressure go up.  Take Coricidin HBP as  directed and antibiotic for symptom management, drink plenty of water.    ED Prescriptions     Medication Sig Dispense Auth. Provider   doxycycline (VIBRAMYCIN) 100 MG capsule Take 1 capsule (100 mg total) by mouth 2 (two) times daily. 20 capsule Lavida Patch, Para March, NP      PDMP not reviewed this encounter.   Clancy Gourd, NP 09/17/22 [phone]

## 2023-01-03 ENCOUNTER — Ambulatory Visit
Admission: EM | Admit: 2023-01-03 | Discharge: 2023-01-03 | Disposition: A | Discharge status: Home / Self Care | Payer: 59 | Attending: Emergency Medicine | Admitting: Emergency Medicine

## 2023-01-03 ENCOUNTER — Encounter: Payer: Self-pay | Admitting: *Deleted

## 2023-01-03 DIAGNOSIS — U071 COVID-19: Secondary | ICD-10-CM | POA: Insufficient documentation

## 2023-01-03 DIAGNOSIS — J069 Acute upper respiratory infection, unspecified: Secondary | ICD-10-CM | POA: Insufficient documentation

## 2023-01-03 DIAGNOSIS — Z20822 Contact with and (suspected) exposure to covid-19: Secondary | ICD-10-CM | POA: Diagnosis present

## 2023-01-03 LAB — GROUP A STREP BY PCR: Group A Strep by PCR: NOT DETECTED

## 2023-01-03 LAB — RESP PANEL BY RT-PCR (FLU A&B, COVID) ARPGX2
Influenza A by PCR: NEGATIVE
Influenza B by PCR: NEGATIVE
SARS Coronavirus 2 by RT PCR: POSITIVE — AB

## 2023-01-03 MED ORDER — FLUTICASONE PROPIONATE 50 MCG/ACT NA SUSP: 2.0000 | Freq: Every day | NASAL | 0 refills | Status: AC

## 2023-01-03 MED ORDER — ONDANSETRON 8 MG PO TBDP: ORAL_TABLET | ORAL | 0 refills | Status: AC

## 2023-01-03 NOTE — ED Triage Notes (Signed)
Patient states cough headache and scratchy throat since yesterday, this morning 2-3 bouts of diarrhea.  Taking Theraflu today with no relief

## 2023-01-03 NOTE — Discharge Instructions (Addendum)
Flonase, Mucinex D, saline nasal irrigation with a NeilMed rinse and distilled water as often as you want, Zofran as needed for nausea, push electrolyte containing fluids such as Pedialyte, liquid IV, Gatorade.  We will contact you if your strep, COVID or flu testing comes back abnormal.  1 gram of Tylenol and 600 mg ibuprofen together 3-4 times a day as needed for pain.  Make sure you drink plenty of extra fluids.  Some people find salt water gargles and  Traditional Medicinal's "Throat Coat" tea helpful. Take 5 mL of liquid Benadryl and 5 mL of Maalox. Mix it together, and then hold it in your mouth for as long as you can and then swallow. You may do this 4 times a day.  Honey and lemon dissolved in hot water can also be soothing.  Go to www.goodrx.com  or www.costplusdrugs.com to look up your medications. This will give you a list of where you can find your prescriptions at the most affordable prices. Or ask the pharmacist what the cash price is, or if they have any other discount programs available to help make your medication more affordable. This can be less expensive than what you would pay with insurance.

## 2023-01-06 ENCOUNTER — Ambulatory Visit
Admission: EM | Admit: 2023-01-06 | Discharge: 2023-01-06 | Disposition: A | Discharge status: Home / Self Care | Payer: 59 | Attending: Emergency Medicine | Admitting: Emergency Medicine

## 2023-01-06 DIAGNOSIS — U071 COVID-19: Secondary | ICD-10-CM | POA: Diagnosis not present

## 2023-01-06 MED ORDER — BENZONATATE 100 MG PO CAPS: 200.0000 mg | ORAL_CAPSULE | Freq: Three times a day (TID) | ORAL | 0 refills | Status: AC

## 2023-01-06 MED ORDER — IPRATROPIUM BROMIDE 0.06 % NA SOLN: 2.0000 | Freq: Four times a day (QID) | NASAL | 12 refills | Status: AC

## 2023-01-06 MED ORDER — PROMETHAZINE-DM 6.25-15 MG/5ML PO SYRP: 5.0000 mL | ORAL_SOLUTION | Freq: Four times a day (QID) | ORAL | 0 refills | Status: AC | PRN

## 2023-01-06 NOTE — ED Provider Notes (Signed)
 [facility]    CSN: [id number] Arrival date & time: 01/06/23  1614      History   Chief Complaint Chief Complaint  Patient presents with   Covid Positive         HPI Jeremy Koch is a 43 y.o. male.   HPI  43 year old male with no significant past medical history presents for evaluation of ongoing nasal congestion and nonproductive cough after being diagnosed with COVID 5 days ago.  He denies any fevers, shortness breath, or wheezing.  No past medical history on file.  Patient Active Problem List   Diagnosis Date Noted   Acute URI 09/17/2022   Elevated blood pressure reading 09/17/2022   Nonspecific syndrome suggestive of viral illness 09/13/2022    Past Surgical History:  Procedure Laterality Date   NO PAST SURGERIES         Home Medications    Prior to Admission medications   Medication Sig Start Date End Date Taking? Authorizing Provider  benzonatate  (TESSALON ) 100 MG capsule Take 2 capsules (200 mg total) by mouth every 8 (eight) hours. 01/06/23  Yes Bernardino Ditch, NP  ipratropium (ATROVENT ) 0.06 % nasal spray Place 2 sprays into both nostrils 4 (four) times daily. 01/06/23  Yes Bernardino Ditch, NP  promethazine -dextromethorphan (PROMETHAZINE -DM) 6.25-15 MG/5ML syrup Take 5 mLs by mouth 4 (four) times daily as needed. 01/06/23  Yes Bernardino Ditch, NP  fluticasone  (FLONASE ) 50 MCG/ACT nasal spray Place 2 sprays into both nostrils daily. 01/03/23  Yes Van Knee, MD  ondansetron  (ZOFRAN -ODT) 8 MG disintegrating tablet 1/2- 1 tablet q 8 hr prn nausea, vomiting 01/03/23  Yes Van Knee, MD    Family History Family History  Problem Relation Age of Onset   Healthy Mother    Healthy Father     Social History Social History   Tobacco Use   Smoking status: Never   Smokeless tobacco: Never  Vaping Use   Vaping status: Never Used  Substance Use Topics   Alcohol use: No   Drug use: No     Allergies   Patient has no known  allergies.   Review of Systems Review of Systems  HENT:  Positive for congestion and rhinorrhea.   Respiratory:  Positive for cough. Negative for shortness of breath and wheezing.      Physical Exam Triage Vital Signs ED Triage Vitals  Encounter Vitals Group     BP      Systolic BP Percentile      Diastolic BP Percentile      Pulse      Resp      Temp      Temp src      SpO2      Weight      Height      Head Circumference      Peak Flow      Pain Score      Pain Loc      Pain Education      Exclude from Growth Chart    No data found.  Updated Vital Signs BP (!) 157/102 (BP Location: Left Arm)   Pulse 84   Temp 98.6 F (37 C) (Oral)   Ht 5' 7 (1.702 m)   Wt 200 lb (90.7 kg)   SpO2 95%   BMI 31.32 kg/m   Visual Acuity Right Eye Distance:   Left Eye Distance:   Bilateral Distance:    Right Eye Near:   Left Eye Near:  Bilateral Near:     Physical Exam Vitals and nursing note reviewed.  Constitutional:      Appearance: Normal appearance. He is not ill-appearing.  HENT:     Head: Normocephalic and atraumatic.  Cardiovascular:     Rate and Rhythm: Normal rate and regular rhythm.     Pulses: Normal pulses.     Heart sounds: Normal heart sounds. No murmur heard.    No friction rub. No gallop.  Pulmonary:     Effort: Pulmonary effort is normal.     Breath sounds: Normal breath sounds. No wheezing, rhonchi or rales.  Skin:    General: Skin is warm and dry.     Capillary Refill: Capillary refill takes less than 2 seconds.     Findings: No rash.  Neurological:     General: No focal deficit present.     Mental Status: He is alert and oriented to person, place, and time.      UC Treatments / Results  Labs (all labs ordered are listed, but only abnormal results are displayed) Labs Reviewed - No data to display  EKG   Radiology No results found.  Procedures Procedures (including critical care time)  Medications Ordered in UC Medications -  No data to display  Initial Impression / Assessment and Plan / UC Course  I have reviewed the triage vital signs and the nursing notes.  Pertinent labs & imaging results that were available during my care of the patient were reviewed by me and considered in my medical decision making (see chart for details).   Patient is a pleasant, nontoxic-appearing 43 year old male presenting for evaluation of ongoing nasal congestion and nonproductive cough in the setting of being diagnosed with COVID 5 days ago.  I explained to the patient that viral infections can last 7 to 10 days and that he is only infectious for the first 5 days of symptoms.  His cough may last up to 6 weeks.  Patient is not in any acute respiratory distress and is able speak in full sentence with dyspnea or tachypnea.  Room air oxygen saturation is 95%.  I will discharge him home with after nasal spray to help with the nasal congestion and he can instill 2 squirts up each nostril every 6 hours.  I will also prescribe Tessalon  Perles for cough during the day and Promethazine  DM cough syrup for bedtime.   Final Clinical Impressions(s) / UC Diagnoses   Final diagnoses:  COVID-19     Discharge Instructions      CDC guidelines state that you must wear a mask for the first 5 days of symptoms when you are around other people.  After 5 days you no longer need to mask as you are no longer considered infectious.  There is no longer need to quarantine unless you have a fever.  If you do have a fever then you need to quarantine until you have been fever free for 24 hours without taking Tylenol  and/or ibuprofen .  Use over-the-counter Tylenol  and/or ibuprofen  according to the package instructions as needed for fever and pain.  Use the Atrovent  nasal spray, 2 squirts up each nostril every 6 hours, as needed for nasal congestion and runny nose.  Use the Tessalon  Perles every 8 hours during the day as needed for cough.  Take them with a small sip  of water.  You may experience numbness to the base of your tongue or metallic taste in her mouth, this is normal.  Use the  Promethazine  DM cough syrup at bedtime as needed for cough and congestion.  Be mindful this medication will make you sleepy.  If you develop any worsening respiratory symptoms such as shortness of breath, shortness of breath at rest, feel as though you cannot catch your breath, you are unable to speak in full sentences, or, as a late sign, your lips begin turning blue you need to call 911 and go to the ER for evaluation.      ED Prescriptions     Medication Sig Dispense Auth. Provider   ipratropium (ATROVENT ) 0.06 % nasal spray Place 2 sprays into both nostrils 4 (four) times daily. 15 mL Bernardino Ditch, NP   benzonatate  (TESSALON ) 100 MG capsule Take 2 capsules (200 mg total) by mouth every 8 (eight) hours. 21 capsule Bernardino Ditch, NP   promethazine -dextromethorphan (PROMETHAZINE -DM) 6.25-15 MG/5ML syrup Take 5 mLs by mouth 4 (four) times daily as needed. 118 mL Bernardino Ditch, NP      PDMP not reviewed this encounter.   Bernardino Ditch, NP 01/06/23 [phone]

## 2023-01-06 NOTE — ED Triage Notes (Signed)
 Pt c/o covid positive x5days  Pt states that he has not gotten better and he is having continued cough and nasal congestion.  Pt states he is now having chest congestion starting this morning.

## 2023-01-06 NOTE — Discharge Instructions (Addendum)
 CDC guidelines state that you must wear a mask for the first 5 days of symptoms when you are around other people.  After 5 days you no longer need to mask as you are no longer considered infectious.  There is no longer need to quarantine unless you have a fever.  If you do have a fever then you need to quarantine until you have been fever free for 24 hours without taking Tylenol and/or ibuprofen.  Use over-the-counter Tylenol and/or ibuprofen according to the package instructions as needed for fever and pain.  Use the Atrovent nasal spray, 2 squirts up each nostril every 6 hours, as needed for nasal congestion and runny nose.  Use the Tessalon Perles every 8 hours during the day as needed for cough.  Take them with a small sip of water.  You may experience numbness to the base of your tongue or metallic taste in her mouth, this is normal.  Use the Promethazine DM cough syrup at bedtime as needed for cough and congestion.  Be mindful this medication will make you sleepy.  If you develop any worsening respiratory symptoms such as shortness of breath, shortness of breath at rest, feel as though you cannot catch your breath, you are unable to speak in full sentences, or, as a late sign, your lips begin turning blue you need to call 911 and go to the ER for evaluation.
